# Patient Record
Sex: Female | Born: 1950 | Race: Black or African American | Hispanic: No | Marital: Married | Smoking: Never smoker
Health system: Southern US, Community
[De-identification: ages and names within clinical notes are randomized; demographics above are authoritative.]

## PROBLEM LIST (undated history)

## (undated) DIAGNOSIS — I10 Essential (primary) hypertension: Secondary | ICD-10-CM

## (undated) DIAGNOSIS — A64 Unspecified sexually transmitted disease: Secondary | ICD-10-CM

## (undated) DIAGNOSIS — E785 Hyperlipidemia, unspecified: Secondary | ICD-10-CM

## (undated) DIAGNOSIS — N739 Female pelvic inflammatory disease, unspecified: Secondary | ICD-10-CM

## (undated) DIAGNOSIS — M1990 Unspecified osteoarthritis, unspecified site: Secondary | ICD-10-CM

## (undated) HISTORY — PX: PELVIC LAPAROSCOPY: SHX162

## (undated) HISTORY — PX: TONSILLECTOMY AND ADENOIDECTOMY: SUR1326

## (undated) HISTORY — DX: Essential (primary) hypertension: I10

## (undated) HISTORY — DX: Unspecified sexually transmitted disease: A64

## (undated) HISTORY — DX: Female pelvic inflammatory disease, unspecified: N73.9

## (undated) HISTORY — DX: Hyperlipidemia, unspecified: E78.5

---

## 1977-04-04 HISTORY — PX: DILATION AND CURETTAGE OF UTERUS: SHX78

## 1988-04-04 HISTORY — PX: BREAST BIOPSY: SHX20

## 1999-09-07 ENCOUNTER — Other Ambulatory Visit: Admission: RE | Admit: 1999-09-07 | Discharge: 1999-09-07 | Payer: Self-pay | Admitting: Obstetrics and Gynecology

## 2000-10-24 ENCOUNTER — Other Ambulatory Visit: Admission: RE | Admit: 2000-10-24 | Discharge: 2000-10-24 | Payer: Self-pay | Admitting: Obstetrics and Gynecology

## 2002-01-09 ENCOUNTER — Other Ambulatory Visit: Admission: RE | Admit: 2002-01-09 | Discharge: 2002-01-09 | Payer: Self-pay | Admitting: Obstetrics and Gynecology

## 2003-02-19 ENCOUNTER — Other Ambulatory Visit: Admission: RE | Admit: 2003-02-19 | Discharge: 2003-02-19 | Payer: Self-pay | Admitting: Obstetrics and Gynecology

## 2004-04-05 ENCOUNTER — Other Ambulatory Visit: Admission: RE | Admit: 2004-04-05 | Discharge: 2004-04-05 | Payer: Self-pay | Admitting: Obstetrics and Gynecology

## 2005-04-21 ENCOUNTER — Other Ambulatory Visit: Admission: RE | Admit: 2005-04-21 | Discharge: 2005-04-21 | Payer: Self-pay | Admitting: Obstetrics and Gynecology

## 2005-05-10 ENCOUNTER — Ambulatory Visit: Payer: Self-pay | Admitting: Internal Medicine

## 2005-05-30 ENCOUNTER — Ambulatory Visit: Payer: Self-pay | Admitting: Gastroenterology

## 2005-06-15 ENCOUNTER — Ambulatory Visit: Payer: Self-pay | Admitting: Gastroenterology

## 2005-12-26 ENCOUNTER — Ambulatory Visit: Payer: Self-pay | Admitting: Internal Medicine

## 2006-05-31 ENCOUNTER — Other Ambulatory Visit: Admission: RE | Admit: 2006-05-31 | Discharge: 2006-05-31 | Payer: Self-pay | Admitting: Gynecology

## 2007-02-23 ENCOUNTER — Ambulatory Visit: Payer: Self-pay | Admitting: Internal Medicine

## 2007-02-23 DIAGNOSIS — E785 Hyperlipidemia, unspecified: Secondary | ICD-10-CM

## 2007-02-23 DIAGNOSIS — I10 Essential (primary) hypertension: Secondary | ICD-10-CM | POA: Insufficient documentation

## 2007-02-26 LAB — CONVERTED CEMR LAB
AST: 24 units/L (ref 0–37)
Basophils Absolute: 0 10*3/uL (ref 0.0–0.1)
Bilirubin, Direct: 0.1 mg/dL (ref 0.0–0.3)
Cholesterol: 200 mg/dL (ref 0–200)
Eosinophils Absolute: 0.1 10*3/uL (ref 0.0–0.6)
Eosinophils Relative: 0.7 % (ref 0.0–5.0)
GFR calc Af Amer: 83 mL/min
GFR calc non Af Amer: 68 mL/min
Glucose, Bld: 87 mg/dL (ref 70–99)
HCT: 40.9 % (ref 36.0–46.0)
HDL: 62.1 mg/dL (ref >39.0)
Hemoglobin, Urine: NEGATIVE
LDL Cholesterol: 123 mg/dL — ABNORMAL HIGH (ref 0–99)
Lymphocytes Relative: 45.6 % (ref 12.0–46.0)
MCHC: 33.7 g/dL (ref 30.0–36.0)
MCV: 86.4 fL (ref 78.0–100.0)
Monocytes Absolute: 0.7 10*3/uL (ref 0.2–0.7)
Neutro Abs: 4.1 10*3/uL (ref 1.4–7.7)
Neutrophils Relative %: 45.6 % (ref 43.0–77.0)
Nitrite: NEGATIVE
Potassium: 3.7 meq/L (ref 3.5–5.1)
Sodium: 140 meq/L (ref 135–145)
TSH: 1.02 microintl units/mL (ref 0.35–5.50)
Total CHOL/HDL Ratio: 3.2
Urobilinogen, UA: 0.2 (ref 0.0–1.0)
WBC: 9.1 10*3/uL (ref 4.5–10.5)

## 2007-07-03 LAB — CONVERTED CEMR LAB: Pap Smear: NORMAL

## 2007-07-06 ENCOUNTER — Telehealth (INDEPENDENT_AMBULATORY_CARE_PROVIDER_SITE_OTHER): Payer: Self-pay | Admitting: *Deleted

## 2007-07-18 ENCOUNTER — Other Ambulatory Visit: Admission: RE | Admit: 2007-07-18 | Discharge: 2007-07-18 | Payer: Self-pay | Admitting: Obstetrics and Gynecology

## 2007-08-13 ENCOUNTER — Telehealth (INDEPENDENT_AMBULATORY_CARE_PROVIDER_SITE_OTHER): Payer: Self-pay | Admitting: *Deleted

## 2008-05-14 ENCOUNTER — Telehealth (INDEPENDENT_AMBULATORY_CARE_PROVIDER_SITE_OTHER): Payer: Self-pay | Admitting: *Deleted

## 2008-06-02 ENCOUNTER — Ambulatory Visit: Payer: Self-pay | Admitting: Internal Medicine

## 2008-06-02 LAB — CONVERTED CEMR LAB
ALT: 18 units/L (ref 0–35)
Basophils Relative: 1.1 % (ref 0.0–3.0)
CO2: 33 meq/L — ABNORMAL HIGH (ref 19–32)
Calcium: 9.4 mg/dL (ref 8.4–10.5)
Creatinine, Ser: 0.9 mg/dL (ref 0.4–1.2)
Glucose, Bld: 79 mg/dL (ref 70–99)
Hemoglobin: 13.1 g/dL (ref 12.0–15.0)
Leukocytes, UA: NEGATIVE
Lymphocytes Relative: 44.8 % (ref 12.0–46.0)
MCHC: 33.4 g/dL (ref 30.0–36.0)
Monocytes Relative: 9.6 % (ref 3.0–12.0)
Neutro Abs: 3.7 10*3/uL (ref 1.4–7.7)
Nitrite: NEGATIVE
RBC: 4.52 M/uL (ref 3.87–5.11)
Specific Gravity, Urine: >=1.03 (ref 1.000–1.035)
TSH: 0.73 microintl units/mL (ref 0.35–5.50)
Total CHOL/HDL Ratio: 3.1
Total Protein, Urine: NEGATIVE mg/dL
Total Protein: 7.5 g/dL (ref 6.0–8.3)
Triglycerides: 74 mg/dL (ref 0–149)
pH: 5.5 (ref 5.0–8.0)

## 2008-06-04 LAB — CONVERTED CEMR LAB: Vit D, 25-Hydroxy: 39 ng/mL (ref 30–89)

## 2008-07-28 ENCOUNTER — Ambulatory Visit: Payer: Self-pay | Admitting: Obstetrics and Gynecology

## 2008-07-28 ENCOUNTER — Other Ambulatory Visit: Admission: RE | Admit: 2008-07-28 | Discharge: 2008-07-28 | Payer: Self-pay | Admitting: Obstetrics and Gynecology

## 2008-07-28 ENCOUNTER — Encounter: Payer: Self-pay | Admitting: Obstetrics and Gynecology

## 2009-10-23 ENCOUNTER — Ambulatory Visit: Payer: Self-pay | Admitting: Internal Medicine

## 2009-10-23 LAB — CONVERTED CEMR LAB
ALT: 17 units/L (ref 0–35)
AST: 24 units/L (ref 0–37)
Albumin: 4 g/dL (ref 3.5–5.2)
Alkaline Phosphatase: 62 units/L (ref 39–117)
BUN: 22 mg/dL (ref 6–23)
Basophils Absolute: 0 10*3/uL (ref 0.0–0.1)
Basophils Relative: 0.6 % (ref 0.0–3.0)
Bilirubin Urine: NEGATIVE
Bilirubin, Direct: 0.1 mg/dL (ref 0.0–0.3)
CO2: 30 meq/L (ref 19–32)
Calcium: 9.5 mg/dL (ref 8.4–10.5)
Chloride: 104 meq/L (ref 96–112)
Cholesterol: 188 mg/dL (ref 0–200)
Creatinine, Ser: 0.9 mg/dL (ref 0.4–1.2)
Eosinophils Absolute: 0.1 10*3/uL (ref 0.0–0.7)
Eosinophils Relative: 1.2 % (ref 0.0–5.0)
GFR calc non Af Amer: 80.68 mL/min (ref >60)
Glucose, Bld: 77 mg/dL (ref 70–99)
HCT: 39.8 % (ref 36.0–46.0)
HDL: 58 mg/dL (ref >39.00)
Hemoglobin, Urine: NEGATIVE
Hemoglobin: 13.4 g/dL (ref 12.0–15.0)
Ketones, ur: NEGATIVE mg/dL
LDL Cholesterol: 111 mg/dL — ABNORMAL HIGH (ref 0–99)
Leukocytes, UA: NEGATIVE
Lymphocytes Relative: 44.2 % (ref 12.0–46.0)
Lymphs Abs: 3.1 10*3/uL (ref 0.7–4.0)
MCHC: 33.7 g/dL (ref 30.0–36.0)
MCV: 87.8 fL (ref 78.0–100.0)
Monocytes Absolute: 0.5 10*3/uL (ref 0.1–1.0)
Monocytes Relative: 6.6 % (ref 3.0–12.0)
Neutro Abs: 3.3 10*3/uL (ref 1.4–7.7)
Neutrophils Relative %: 47.4 % (ref 43.0–77.0)
Nitrite: NEGATIVE
Platelets: 252 10*3/uL (ref 150.0–400.0)
Potassium: 4.2 meq/L (ref 3.5–5.1)
RBC: 4.53 M/uL (ref 3.87–5.11)
RDW: 14.4 % (ref 11.5–14.6)
Sodium: 142 meq/L (ref 135–145)
Specific Gravity, Urine: 1.025 (ref 1.000–1.030)
TSH: 1.07 microintl units/mL (ref 0.35–5.50)
Total Bilirubin: 0.4 mg/dL (ref 0.3–1.2)
Total CHOL/HDL Ratio: 3
Total Protein, Urine: NEGATIVE mg/dL
Total Protein: 7.7 g/dL (ref 6.0–8.3)
Triglycerides: 93 mg/dL (ref 0.0–149.0)
Urine Glucose: NEGATIVE mg/dL
Urobilinogen, UA: 0.2 (ref 0.0–1.0)
VLDL: 18.6 mg/dL (ref 0.0–40.0)
WBC: 6.9 10*3/uL (ref 4.5–10.5)
pH: 6 (ref 5.0–8.0)

## 2010-01-15 ENCOUNTER — Ambulatory Visit: Payer: Self-pay | Admitting: Internal Medicine

## 2010-04-07 ENCOUNTER — Ambulatory Visit
Admission: RE | Admit: 2010-04-07 | Discharge: 2010-04-07 | Payer: Self-pay | Source: Home / Self Care | Attending: Internal Medicine | Admitting: Internal Medicine

## 2010-04-09 ENCOUNTER — Telehealth: Payer: Self-pay | Admitting: Internal Medicine

## 2010-05-04 NOTE — Assessment & Plan Note (Signed)
Summary: MED-PER PT WAS TOLD TO SCHED  STC   Vital Signs:  Patient profile:   60 year old female Height:      65 inches Weight:      165.75 pounds BMI:     27.68 O2 Sat:      96 % on Room air Temp:     99.2 degrees F oral Pulse rate:   73 / minute BP sitting:   142 / 90  (left arm) Cuff size:   regular  Vitals Entered By: Zella Ball Ewing CMA Duncan Dull) (October 23, 2009 8:07 AM)  O2 Flow:  Room air  Preventive Care Screening  Pap Smear:    Date:  06/02/2009    Results:  normal   Mammogram:    Date:  06/02/2009    Results:  normal   CC: Refills on medication/RE   CC:  Refills on medication/RE.  History of Present Illness: here to f/u - walks just about every other day outside [place] 2 miles, some zoomba classes as well;  wt overall stable ,  BP similar to today when saw GYN;  Pt denies CP, sob, doe, wheezing, orthopnea, pnd, worsening LE edema, palps, dizziness or syncope .  Pt denies new neuro symptoms such as headache, facial or extremity weakness  No fever, wt loss, night sweats, loss of appetite or other constitutional symptoms   Preventive Screening-Counseling & Management      Drug Use:  no.    Problems Prior to Update: 1)  Preventive Health Care  (ICD-V70.0) 2)  Hypertension  (ICD-401.9) 3)  Hyperlipidemia  (ICD-272.4) 4)  Preventive Health Care  (ICD-V70.0)  Medications Prior to Update: 1)  Simvastatin 40 Mg  Tabs (Simvastatin) .Marland Kitchen.. 1po Once Daily 2)  Hydrochlorothiazide 25 Mg Tabs (Hydrochlorothiazide) .Marland Kitchen.. 1 By Mouth Once Daily 3)  Metoprolol Tartrate 50 Mg Tabs (Metoprolol Tartrate) .Marland Kitchen.. 1 By Mouth Two Times A Day 4)  Multi-Day Vitamins   Tabs (Multiple Vitamin) .Marland Kitchen.. 1 Daily 5)  Calcium 600 600 Mg  Tabs (Calcium Carbonate) .Marland Kitchen.. 1 Every Day 6)  Adult Aspirin Ec Low Strength 81 Mg Tbec (Aspirin) .Marland Kitchen.. 1po Once Daily  Current Medications (verified): 1)  Simvastatin 40 Mg  Tabs (Simvastatin) .Marland Kitchen.. 1po Once Daily 2)  Atenolol-Chlorthalidone 100-25 Mg Tabs  (Atenolol-Chlorthalidone) .Marland Kitchen.. 1po Once Daily 3)  Multi-Day Vitamins   Tabs (Multiple Vitamin) .Marland Kitchen.. 1 Daily 4)  Calcium 600 600 Mg  Tabs (Calcium Carbonate) .Marland Kitchen.. 1 Every Day 5)  Adult Aspirin Ec Low Strength 81 Mg Tbec (Aspirin) .Marland Kitchen.. 1po Once Daily 6)  Lisinopril 20 Mg Tabs (Lisinopril) .Marland Kitchen.. 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 father with colon cancer mother with DM  Social History: Reviewed history from 06/02/2008 and no changes required. 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         all otherwise negative per pt -    Physical Exam  General:  alert and overweight-appearing.   Head:  normocephalic and atraumatic.   Eyes:  vision grossly intact, pupils equal, and pupils round.   Ears:  R ear normal and L ear normal.   Nose:  no external deformity and no nasal  discharge.   Mouth:  no gingival abnormalities and pharynx pink and moist.   Neck:  supple and no masses.   Lungs:  normal respiratory effort and normal breath sounds.   Heart:  normal rate and regular rhythm.   Abdomen:  soft, non-tender, and normal bowel sounds.   Msk:  no joint tenderness and no joint swelling.   Extremities:  no edema, no erythema  Neurologic:  cranial nerves II-XII intact and strength normal in all extremities.   Skin:  color normal and no rashes.   Psych:  memory intact for recent and remote and normally interactive.     Impression & Recommendations:  Problem # 1:  Preventive Health Care (ICD-V70.0)  Overall doing well, age appropriate education and counseling updated and referral for appropriate preventive services done unless declined, immunizations up to date or declined, diet counseling done if overweight, urged to quit smoking if smokes , most recent labs reviewed and current ordered if appropriate, ecg reviewed or declined (interpretation per ECG scanned in the EMR if done); information regarding Medicare Prevention requirements given if appropriate; speciality referrals updated as appropriate   Orders: TLB-BMP (Basic Metabolic Panel-BMET) (80048-METABOL) TLB-CBC Platelet - w/Differential (85025-CBCD) TLB-Hepatic/Liver Function Pnl (80076-HEPATIC) TLB-Lipid Panel (80061-LIPID) TLB-TSH (Thyroid Stimulating Hormone) (84443-TSH) TLB-Udip ONLY (81003-UDIP) T-Vitamin D (25-Hydroxy) (47829-56213)  Problem # 2:  HYPERTENSION (ICD-401.9)  The following medications were removed from the medication list:    Hydrochlorothiazide 25 Mg Tabs (Hydrochlorothiazide) .Marland Kitchen... 1 by mouth once daily Her updated medication list for this problem includes:    Atenolol-chlorthalidone 100-25 Mg Tabs (Atenolol-chlorthalidone) .Marland Kitchen... 1po once daily    Lisinopril 20 Mg Tabs (Lisinopril) .Marland Kitchen... 1 by mouth once daily uncontroled, meds adjusted , cont to monitor BP at home,   Problem #  3:  HYPERLIPIDEMIA (ICD-272.4)  Her updated medication list for this problem includes:    Simvastatin 40 Mg Tabs (Simvastatin) .Marland Kitchen... 1po once daily  Labs Reviewed: SGOT: 22 (06/02/2008)   SGPT: 18 (06/02/2008)   HDL:52.3 (06/02/2008), 62.1 (02/23/2007)  LDL:96 (06/02/2008), 123 (08/65/7846)  Chol:163 (06/02/2008), 200 (02/23/2007)  Trig:74 (06/02/2008), 72 (02/23/2007) stable overall by hx and exam, ok to continue meds/tx as is   Complete Medication List: 1)  Simvastatin 40 Mg Tabs (Simvastatin) .Marland Kitchen.. 1po once daily 2)  Atenolol-chlorthalidone 100-25 Mg Tabs (Atenolol-chlorthalidone) .Marland Kitchen.. 1po once daily 3)  Multi-day Vitamins Tabs (Multiple vitamin) .Marland Kitchen.. 1 daily 4)  Calcium 600 600 Mg Tabs (Calcium carbonate) .Marland Kitchen.. 1 every day 5)  Adult Aspirin Ec Low Strength 81 Mg Tbec (Aspirin) .Marland Kitchen.. 1po once daily 6)  Lisinopril 20 Mg Tabs (Lisinopril) .Marland Kitchen.. 1 by mouth once daily  Patient Instructions: 1)  Take an Aspirin every day - 81 mg  - 1 per day - COATED  2)  stop the metoprolol 3)  stop the hydrochlorothiazide 4)  start the atenolol-HCTZ  - 1 per day 5)  start the lisinopril 20 mg per  day - 1 per day 6)  Continue all previous medications as before this visit  7)  Please schedule a follow-up appointment in 3 months with: 8)  BMP prior to visit, ICD-9: 401.1 Prescriptions: SIMVASTATIN 40 MG  TABS (SIMVASTATIN) 1po once daily  #90 x 3   Entered and Authorized by:   Corwin Levins MD   Signed by:   Corwin Levins MD on 10/23/2009   Method used:   Electronically to        [facility]  [address] # [id number]* (retail)       [address]       [place], [place]  [address]       Ph: [phone] or [phone]       Fax: [phone]   RxID:   [id number] LISINOPRIL 20 MG TABS (LISINOPRIL) 1 by mouth once daily  #90 x 3   Entered and Authorized by:   Corwin Levins MD   Signed by:   Corwin Levins MD on 10/23/2009   Method used:   Electronically to        [facility]  [address] #  [id number]* (retail)       [address]       [place], [place]  [address]       Ph: [phone] or [phone]       Fax: [phone]   RxID:   [id number] ATENOLOL-CHLORTHALIDONE 100-25 MG TABS (ATENOLOL-CHLORTHALIDONE) 1po once daily  #90 x 3   Entered and Authorized by:   Corwin Levins MD   Signed by:   Corwin Levins MD on 10/23/2009   Method used:   Electronically to        [facility]  [address] [id number]* (retail)       [address]       [place], [place]  [address]       Ph: [phone] or [phone]       Fax: [phone]   RxID:   [id number]

## 2010-05-06 NOTE — Assessment & Plan Note (Signed)
Summary: cough/sore throat/cd   Vital Signs:  Patient profile:   60 year old female Height:      65 inches Weight:      164.50 pounds BMI:     27.47 O2 Sat:      98 % on Room air Temp:     98.7 degrees F oral Pulse rate:   67 / minute BP sitting:   122 / 78  (left arm) Cuff size:   regular  Vitals Entered By: Zella Ball Ewing CMA (AAMA) (April 07, 2010 2:58 PM)  O2 Flow:  Room air CC: Sore throat, congestion and cough/RE   CC:  Sore throat and congestion and cough/RE.  History of Present Illness: here with acute onset 1 wk fever, severe ST not improving with several OTC meds, and now increased prod cough greenish sputum in the past 2 days, but Pt denies CP, worsening sob, doe, wheezing, orthopnea, pnd, worsening LE edema, palps, dizziness or syncope  Pt denies new neuro symptoms such as headache, facial or extremity weakness  Pt denies polydipsia, polyuria,  Overall good compliance with meds, trying to follow low chol diet, wt stable, little excercise however .  No recent wt loss, night sweats, loss of appetite or other constitutional symptoms .  Overall good compliance with meds, and good tolerability.  Needs med refills as she is almost out.    Problems Prior to Update: 1)  Pharyngitis-acute  (ICD-462) 2)  Preventive Health Care  (ICD-V70.0) 3)  Hypertension  (ICD-401.9) 4)  Hyperlipidemia  (ICD-272.4) 5)  Preventive Health Care  (ICD-V70.0)  Medications Prior to Update: 1)  Simvastatin 40 Mg  Tabs (Simvastatin) .Marland Kitchen.. 1po Once Daily 2)  Atenolol-Chlorthalidone 100-25 Mg Tabs (Atenolol-Chlorthalidone) .Marland Kitchen.. 1po Once Daily 3)  Multi-Day Vitamins   Tabs (Multiple Vitamin) .Marland Kitchen.. 1 Daily 4)  Calcium 600 600 Mg  Tabs (Calcium Carbonate) .Marland Kitchen.. 1 Every Day 5)  Adult Aspirin Ec Low Strength 81 Mg Tbec (Aspirin) .Marland Kitchen.. 1po Once Daily 6)  Lisinopril 20 Mg Tabs (Lisinopril) .Marland Kitchen.. 1 By Mouth Once Daily  Current Medications (verified): 1)  Simvastatin 40 Mg  Tabs (Simvastatin) .Marland Kitchen.. 1po Once  Daily 2)  Atenolol-Chlorthalidone 100-25 Mg Tabs (Atenolol-Chlorthalidone) .Marland Kitchen.. 1po Once Daily 3)  Multi-Day Vitamins   Tabs (Multiple Vitamin) .Marland Kitchen.. 1 Daily 4)  Calcium 600 600 Mg  Tabs (Calcium Carbonate) .Marland Kitchen.. 1 Every Day 5)  Adult Aspirin Ec Low Strength 81 Mg Tbec (Aspirin) .Marland Kitchen.. 1po Once Daily 6)  Lisinopril 20 Mg Tabs (Lisinopril) .Marland Kitchen.. 1 By Mouth Once Daily 7)  Azithromycin 250 Mg Tabs (Azithromycin) .... 2po Qd For 1 Day, Then 1po Qd For 4days, Then Stop 8)  Hydrocodone-Homatropine 5-1.5 Mg/72ml Syrp (Hydrocodone-Homatropine) .Marland Kitchen.. 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 , mild ill  Head:  normocephalic and atraumatic.   Eyes:  vision grossly intact, pupils equal, and pupils round.   Ears:  bilat tm's red, sinus nontender Nose:  nasal dischargemucosal pallor and  mucosal edema.   Mouth:  pharyngeal erythema and fair dentition.   Neck:  supple and cervical lymphadenopathy.   Lungs:  normal respiratory effort and normal breath sounds.   Heart:  normal rate and regular rhythm.   Extremities:  no edema, no erythema    Impression & Recommendations:  Problem # 1:  PHARYNGITIS-ACUTE (ICD-462)  Her updated medication list for this problem includes:    Adult Aspirin Ec Low Strength 81 Mg Tbec (Aspirin) .Marland Kitchen... 1po once daily    Azithromycin 250 Mg Tabs (Azithromycin) .Marland Kitchen... 2po qd for 1 day, then 1po qd for 4days, then stop with significant  cough - treat as above, f/u any worsening signs or symptoms   Problem # 2:  HYPERTENSION (ICD-401.9)  Her updated medication list for this problem includes:    Atenolol-chlorthalidone 100-25 Mg Tabs (Atenolol-chlorthalidone) .Marland Kitchen... 1po once daily    Lisinopril 20 Mg Tabs (Lisinopril) .Marland Kitchen... 1 by mouth once daily  BP today: 122/78 Prior BP: 142/90 (10/23/2009)  Labs Reviewed: K+: 4.2 (10/23/2009) Creat: : 0.9 (10/23/2009)   Chol: 188 (10/23/2009)   HDL: 58.00 (10/23/2009)   LDL: 111 (10/23/2009)   TG: 93.0 (10/23/2009) stable overall by hx and exam, ok to continue meds/tx as is   Problem # 3:  HYPERLIPIDEMIA (ICD-272.4)  Her updated medication list for this problem includes:    Simvastatin 40 Mg Tabs (Simvastatin) .Marland Kitchen... 1po once daily  Labs Reviewed: SGOT: 24 (10/23/2009)   SGPT: 17 (10/23/2009)   HDL:58.00 (10/23/2009), 52.3 (06/02/2008)  LDL:111 (10/23/2009), 96 (52/84/1324)  Chol:188 (10/23/2009), 163 (06/02/2008)  Trig:93.0 (10/23/2009), 74 (06/02/2008) d/w pt, labs reviewed, Continue all previous medications as before this visit , Pt to continue diet efforts, good med tolerance; to check labs next visit - goal LDL less than 100  Complete Medication List: 1)  Simvastatin 40 Mg Tabs (Simvastatin) .Marland Kitchen.. 1po once daily 2)  Atenolol-chlorthalidone 100-25 Mg Tabs (Atenolol-chlorthalidone) .Marland Kitchen.. 1po once daily 3)  Multi-day Vitamins Tabs (Multiple vitamin) .Marland Kitchen.. 1 daily 4)  Calcium 600 600 Mg Tabs (Calcium carbonate) .Marland Kitchen.. 1 every day 5)  Adult Aspirin Ec Low Strength 81 Mg Tbec (Aspirin) .Marland Kitchen.. 1po once daily 6)  Lisinopril 20 Mg Tabs (Lisinopril) .Marland Kitchen.. 1 by mouth once daily 7)  Azithromycin 250 Mg Tabs (Azithromycin) .... 2po qd for 1 day, then 1po qd for 4days, then stop 8)  Hydrocodone-homatropine 5-1.5 Mg/26ml Syrp (Hydrocodone-homatropine) .Marland Kitchen.. 1 tsp by mouth q 6 hrs as needed  Patient Instructions: 1)  Please take all new medications as prescribed 2)  Continue all previous medications  as before this visit  3)  Please schedule a follow-up appointment in July 2012 for CPX with labs Prescriptions: LISINOPRIL 20 MG TABS (LISINOPRIL) 1 by mouth once daily  #90 x 3   Entered and Authorized by:   Corwin Levins MD   Signed by:   Corwin Levins MD on 04/07/2010   Method used:   Print then Give to Patient   RxID:   [id number] ATENOLOL-CHLORTHALIDONE 100-25 MG TABS (ATENOLOL-CHLORTHALIDONE) 1po once daily  #90 x 3   Entered and Authorized by:   Corwin Levins MD   Signed by:   Corwin Levins MD on 04/07/2010   Method used:   Print then Give to Patient   RxID:   [id number] SIMVASTATIN 40 MG  TABS (SIMVASTATIN) 1po once daily  #90 x 3   Entered and Authorized by:   Corwin Levins MD   Signed by:   Len Blalock  John MD on 04/07/2010   Method used:   Print then Give to Patient   RxID:   [id number] HYDROCODONE-HOMATROPINE 5-1.5 MG/5ML SYRP (HYDROCODONE-HOMATROPINE) 1 tsp by mouth q 6 hrs as needed  #6oz x 1   Entered and Authorized by:   Corwin Levins MD   Signed by:   Corwin Levins MD on 04/07/2010   Method used:   Print then Give to Patient   RxID:   [id number] AZITHROMYCIN 250 MG TABS (AZITHROMYCIN) 2po qd for 1 day, then 1po qd for 4days, then stop  #6 x 1   Entered and Authorized by:   Corwin Levins MD   Signed by:   Corwin Levins MD on 04/07/2010   Method used:   Print then Give to Patient   RxID:   [id number]    Orders Added: 1)  Est. Patient Level IV [01027]

## 2010-05-06 NOTE — Progress Notes (Signed)
Summary: ALT med  Phone Note Call from Patient Call back at [facility] Phone [phone]   Caller: Patient Summary of Call: Pt called stating that ABX Rxd at OV is causing severe nausea, pt is requesting alternate med. Initial call taken by: Margaret Pyle, CMA,  April 09, 2010 8:32 AM  Follow-up for Phone Call        zpack does not normally do this, but ok to change  - done per emr Follow-up by: Corwin Levins MD,  April 09, 2010 12:59 PM  Additional Follow-up for Phone Call Additional follow up Details #1::        Pt advised to take meds with food and try BLAND diet for nausea as well. Pt told of Rx change Additional Follow-up by: Margaret Pyle, CMA,  April 09, 2010 1:30 PM    New/Updated Medications: CEPHALEXIN 500 MG CAPS (CEPHALEXIN) 1po three times a day Prescriptions: CEPHALEXIN 500 MG CAPS (CEPHALEXIN) 1po three times a day  #30 x 0   Entered and Authorized by:   Corwin Levins MD   Signed by:   Corwin Levins MD on 04/09/2010   Method used:   Electronically to        [facility]  [address] [id number]* (retail)       [address]       [place], [place]  [address]       Ph: [phone] or [phone]       Fax: [phone]   RxID:   [id number]

## 2010-10-29 ENCOUNTER — Encounter: Payer: Self-pay | Admitting: Gynecology

## 2011-04-09 ENCOUNTER — Other Ambulatory Visit: Payer: Self-pay | Admitting: Internal Medicine

## 2011-07-12 ENCOUNTER — Other Ambulatory Visit: Payer: Self-pay | Admitting: Internal Medicine

## 2011-07-26 ENCOUNTER — Other Ambulatory Visit: Payer: Self-pay | Admitting: Obstetrics and Gynecology

## 2011-08-11 ENCOUNTER — Telehealth: Payer: Self-pay

## 2011-08-12 ENCOUNTER — Encounter: Payer: Self-pay | Admitting: Internal Medicine

## 2011-08-12 ENCOUNTER — Ambulatory Visit (INDEPENDENT_AMBULATORY_CARE_PROVIDER_SITE_OTHER): Payer: BC Managed Care – PPO | Admitting: Internal Medicine

## 2011-08-12 VITALS — BP 160/88 | HR 67 | Temp 98.9°F | Ht 65.0 in | Wt 165.6 lb

## 2011-08-12 DIAGNOSIS — R0981 Nasal congestion: Secondary | ICD-10-CM

## 2011-08-12 DIAGNOSIS — J309 Allergic rhinitis, unspecified: Secondary | ICD-10-CM

## 2011-08-12 DIAGNOSIS — J3489 Other specified disorders of nose and nasal sinuses: Secondary | ICD-10-CM

## 2011-08-12 DIAGNOSIS — I10 Essential (primary) hypertension: Secondary | ICD-10-CM

## 2011-08-12 MED ORDER — PROMETHAZINE-PHENYLEPHRINE 6.25-5 MG/5ML PO SYRP: 5.0000 mL | ORAL_SOLUTION | Freq: Four times a day (QID) | ORAL | Status: DC | PRN

## 2011-08-12 MED ORDER — FEXOFENADINE HCL 180 MG PO TABS: 180.0000 mg | ORAL_TABLET | Freq: Every day | ORAL | Status: DC

## 2011-08-12 MED ORDER — FLUTICASONE PROPIONATE 50 MCG/ACT NA SUSP: 2.0000 | Freq: Every day | NASAL | Status: DC

## 2011-08-12 NOTE — Patient Instructions (Signed)
It was good to see you today. If you develop worsening symptoms or fever, call and we can reconsider antibiotics, but it does not appear necessary to use antibiotics at this time. STOP AFRIN Start flonase nose spray, continue allegra every day and use syrup as needed for congestion/cough - Your prescription(s) have been submitted to your pharmacy. Please take as directed and contact our office if you believe you are having problem(s) with the medication(s). Please take your blood pressure medications as prescribed! Please schedule followup in 2-3 months for /bp and cholesterol check, call sooner if problems.

## 2011-08-12 NOTE — Assessment & Plan Note (Signed)
BP Readings from Last 3 Encounters:  08/12/11 160/88  04/07/10 122/78  10/23/09 142/90   There have been some probable medication compliance issues here. I have discussed with her the great importance of following the treatment plan exactly as directed in order to achieve a good medical outcome.

## 2011-08-12 NOTE — Progress Notes (Signed)
  Subjective:    HPI  complains of "head cold" symptoms  Onset >3 week ago, wax/wane symptoms  associated with rhinorrhea, sneezing, sore throat, mild headache and sinus pressure  temporary relief with OTC meds - using Afrin daily x 4 weeks Precipitated by sick contacts and season change  Not taking rx'd meds for blood pressure and chol  Past Medical History  Diagnosis Date  . Hypertension   . Dyslipidemia     Review of Systems Constitutional: No fever, no unexpected weight change Pulmonary: No pleurisy or hemoptysis Cardiovascular: No chest pain or palpitations     Objective:   Physical Exam BP 160/88  Pulse 67  Temp(Src) 98.9 F (37.2 C) (Temporal)  Ht 5\' 5"  (1.651 m)  Wt 165 lb 9.6 oz (75.116 kg)  BMI 27.56 kg/m2  SpO2 99% GEN: nontoxic appearing but audible head congestion HENT: NCAT, no sinus tenderness bilaterally, nares with swollen turbinates, pale and scant clear discharge, oropharynx mild erythema and cobblestoning, no exudate Eyes: Vision grossly intact, no conjunctivitis Lungs: Clear to auscultation without rhonchi or wheeze, no increased work of breathing Cardiovascular: Regular rate and rhythm, no bilateral edema      Assessment & Plan:  allergic rhinitis  Nasal congestion - component of rebound with chronic Afrin Cough and postnasal drip related to above hypertension   Stop Afrin Explained lack of efficacy for antibiotics without infection Flonase, Allegra, prescription cough and decongestant syrup - new prescriptions done Symptomatic care with Tylenol or Advil, hydration and rest -  salt gargle advised as needed Reminded of importance of med compliance with blood pressure meds

## 2011-08-18 ENCOUNTER — Telehealth: Payer: Self-pay

## 2011-08-18 MED ORDER — ANTIPYRINE-BENZOCAINE 5.4-1.4 % OT SOLN: 3.0000 [drp] | OTIC | Status: AC | PRN

## 2011-08-18 MED ORDER — AMOXICILLIN-POT CLAVULANATE 875-125 MG PO TABS: 1.0000 | ORAL_TABLET | Freq: Two times a day (BID) | ORAL | Status: AC

## 2011-08-18 NOTE — Telephone Encounter (Signed)
continue tx as rx'd - Also add Augmentin antibiotics bid x 1 week and use ear drops as needed for pain - both erx done

## 2011-08-18 NOTE — Telephone Encounter (Signed)
Pt advised of MD's recommendation, Rx/pharmacy.

## 2011-08-18 NOTE — Telephone Encounter (Signed)
Pt called stating that since OV she has developed a sever LT ear ache, no discharge but causing some dizziness. Pt is requesting advisement from MD.

## 2011-08-23 NOTE — Telephone Encounter (Signed)
error 

## 2011-08-25 ENCOUNTER — Other Ambulatory Visit: Payer: Self-pay | Admitting: Obstetrics and Gynecology

## 2011-10-06 ENCOUNTER — Encounter: Payer: Self-pay | Admitting: Internal Medicine

## 2011-10-06 DIAGNOSIS — Z0001 Encounter for general adult medical examination with abnormal findings: Secondary | ICD-10-CM | POA: Insufficient documentation

## 2011-10-14 ENCOUNTER — Ambulatory Visit: Payer: BC Managed Care – PPO | Admitting: Internal Medicine

## 2011-11-02 ENCOUNTER — Encounter: Payer: Self-pay | Admitting: Obstetrics and Gynecology

## 2011-11-07 ENCOUNTER — Encounter: Payer: Self-pay | Admitting: Gynecology

## 2011-11-07 DIAGNOSIS — E785 Hyperlipidemia, unspecified: Secondary | ICD-10-CM | POA: Insufficient documentation

## 2011-11-07 DIAGNOSIS — N739 Female pelvic inflammatory disease, unspecified: Secondary | ICD-10-CM | POA: Insufficient documentation

## 2011-11-16 ENCOUNTER — Ambulatory Visit: Payer: BC Managed Care – PPO | Admitting: Obstetrics and Gynecology

## 2011-11-22 ENCOUNTER — Encounter: Payer: Self-pay | Admitting: Obstetrics and Gynecology

## 2011-11-22 ENCOUNTER — Other Ambulatory Visit (HOSPITAL_COMMUNITY)
Admission: RE | Admit: 2011-11-22 | Discharge: 2011-11-22 | Disposition: A | Discharge status: Home / Self Care | Payer: BC Managed Care – PPO | Source: Ambulatory Visit | Attending: Obstetrics and Gynecology | Admitting: Obstetrics and Gynecology

## 2011-11-22 ENCOUNTER — Ambulatory Visit (INDEPENDENT_AMBULATORY_CARE_PROVIDER_SITE_OTHER): Payer: BC Managed Care – PPO | Admitting: Obstetrics and Gynecology

## 2011-11-22 VITALS — BP 144/86 | Ht 64.0 in | Wt 159.0 lb

## 2011-11-22 DIAGNOSIS — Z01419 Encounter for gynecological examination (general) (routine) without abnormal findings: Secondary | ICD-10-CM

## 2011-11-22 DIAGNOSIS — A64 Unspecified sexually transmitted disease: Secondary | ICD-10-CM | POA: Insufficient documentation

## 2011-11-22 MED ORDER — ACYCLOVIR 5 % EX OINT: 1.0000 "application " | TOPICAL_OINTMENT | CUTANEOUS | Status: DC

## 2011-11-22 MED ORDER — VALACYCLOVIR HCL 500 MG PO TABS: 500.0000 mg | ORAL_TABLET | Freq: Every day | ORAL | Status: DC

## 2011-11-22 NOTE — Progress Notes (Signed)
The patient came to see me today for an annual GYN exam. This was a new patient visit as we had not seen the patient for 3-1/2 years. She is menopausal. She is doing well without hormone replacement. She had a mammogram this Summer which was normal. When she used to be a patient of ours she had 2 normal bone densities with the last one in May, 2009. We have previously treated her with Valtrex to prevent HSV breakouts. She is also use the ointment when she does get a breakout. She is having no vaginal bleeding. She is having no pelvic pain. She does her lab work through her PCP. 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 She has had recalls for Pap smears but is never been treated for cervical dysplasia. Her last Pap smear was 2010 and was normal.  Past medical history, family history, social history in epic record and reviewed.  Physical examination: Kennon Portela present. HEENT within normal limits. Neck: Thyroid not large. No masses. Supraclavicular nodes: not enlarged. Breasts: Examined in both sitting and lying  position. No skin changes and no masses except behind the left nipple there is 1-2 mm firmness unchanged since 2010 and normal on  mammogram. Abdomen: Soft no guarding rebound or masses or hernia. Pelvic: External: Within normal limits. BUS: Within normal limits. Vaginal:within normal limits. Good estrogen effect. No evidence of cystocele rectocele or enterocele. Cervix: clean. Uterus: Normal size and shape. Adnexa: No masses. Rectovaginal exam: Confirmatory and negative. Extremities: Within normal limits.  Assessment: HSV-2. History of PID. #3 fibrocystic breasts  Plan: Valtrex 500 mg daily. Zovirax ointment when necessary.The new Pap smear guidelines were discussed with the patient. Pap done. Continue yearly mammograms. Reassured that change in left breast nipple is unchanged from 3-1/2 years ago.

## 2011-11-22 NOTE — Patient Instructions (Addendum)
Continue yearly mammograms 

## 2011-11-23 LAB — URINALYSIS W MICROSCOPIC + REFLEX CULTURE
Casts: NONE SEEN
Hgb urine dipstick: NEGATIVE
Leukocytes, UA: NEGATIVE
Nitrite: NEGATIVE
Specific Gravity, Urine: 1.029 (ref 1.005–1.030)
pH: 5.5 (ref 5.0–8.0)

## 2011-11-29 ENCOUNTER — Encounter: Payer: Self-pay | Admitting: Obstetrics and Gynecology

## 2012-01-27 ENCOUNTER — Other Ambulatory Visit: Payer: Self-pay | Admitting: Internal Medicine

## 2012-01-27 ENCOUNTER — Telehealth: Payer: Self-pay

## 2012-01-27 MED ORDER — ATENOLOL-CHLORTHALIDONE 100-25 MG PO TABS: 1.0000 | ORAL_TABLET | Freq: Every day | ORAL | Status: DC

## 2012-01-27 NOTE — Telephone Encounter (Signed)
Prescription filled.

## 2012-01-30 ENCOUNTER — Ambulatory Visit: Payer: BC Managed Care – PPO | Admitting: Internal Medicine

## 2012-01-31 ENCOUNTER — Encounter: Payer: Self-pay | Admitting: Internal Medicine

## 2012-01-31 ENCOUNTER — Ambulatory Visit (INDEPENDENT_AMBULATORY_CARE_PROVIDER_SITE_OTHER): Payer: BC Managed Care – PPO | Admitting: Internal Medicine

## 2012-01-31 VITALS — BP 120/78 | HR 55 | Temp 96.9°F | Ht 64.0 in | Wt 168.4 lb

## 2012-01-31 DIAGNOSIS — Z23 Encounter for immunization: Secondary | ICD-10-CM

## 2012-01-31 DIAGNOSIS — Z0000 Encounter for general adult medical examination without abnormal findings: Secondary | ICD-10-CM

## 2012-01-31 MED ORDER — ATENOLOL-CHLORTHALIDONE 100-25 MG PO TABS: 1.0000 | ORAL_TABLET | Freq: Every day | ORAL | Status: DC

## 2012-01-31 MED ORDER — LISINOPRIL 20 MG PO TABS: 20.0000 mg | ORAL_TABLET | Freq: Every day | ORAL | Status: DC

## 2012-01-31 MED ORDER — SIMVASTATIN 40 MG PO TABS: 40.0000 mg | ORAL_TABLET | Freq: Every day | ORAL | Status: DC

## 2012-01-31 NOTE — Assessment & Plan Note (Signed)

## 2012-01-31 NOTE — Progress Notes (Signed)
Subjective:    Patient ID: Kelli Jones, female    DOB: March 08, 1951, 61 y.o.   MRN: [id number]  HPI  Here for wellness and f/u;  Overall doing ok;  Pt denies CP, worsening SOB, DOE, wheezing, orthopnea, PND, worsening LE edema, palpitations, dizziness or syncope.  Pt denies neurological change such as new Headache, facial or extremity weakness.  Pt denies polydipsia, polyuria, or low sugar symptoms. Pt states overall good compliance with treatment and medications, good tolerability, and trying to follow lower cholesterol diet.  Pt denies worsening depressive symptoms, suicidal ideation or panic. No fever, wt loss, night sweats, loss of appetite, or other constitutional symptoms.  Pt states good ability with ADL's, low fall risk, home safety reviewed and adequate, no significant changes in hearing or vision, and occasionally active with exercise.  Has some minor nasal allergy symptoms.  Due for flu shot.  No acute complaints Past Medical History  Diagnosis Date  . Hypertension   . Dyslipidemia   . PID (pelvic inflammatory disease)     Tubal obstruction  . STD (sexually transmitted disease)     HSV   Past Surgical History  Procedure Date  . Breast biopsy 1990    neg  . Dilation and curettage of uterus 1979  . Pelvic laparoscopy     DL tubal lavage    reports that she has never smoked. She has never used smokeless tobacco. She reports that she drinks alcohol. She reports that she does not use illicit drugs. family history includes Colon cancer in her father; Diabetes in her mother; Hypertension in her mother; and Kidney cancer in her father. No Known Allergies Current Outpatient Prescriptions on File Prior to Visit  Medication Sig Dispense Refill  . DISCONTD: atenolol-chlorthalidone (TENORETIC) 100-25 MG per tablet Take 1 tablet by mouth daily.  30 tablet  0  . DISCONTD: lisinopril (PRINIVIL,ZESTRIL) 20 MG tablet take 1 tablet by mouth once daily  90 tablet  0  . DISCONTD: simvastatin  (ZOCOR) 40 MG tablet take 1 tablet by mouth once daily  30 tablet  0  . acyclovir ointment (ZOVIRAX) 5 % Apply 1 application topically every 3 (three) hours.  3 g  5  . fexofenadine (ALLEGRA) 180 MG tablet Take 1 tablet (180 mg total) by mouth daily.      . fluticasone (FLONASE) 50 MCG/ACT nasal spray Place 2 sprays into the nose daily.  16 g  6  . promethazine-phenylephrine (PROMETHAZINE-PHENYLEPHRINE) 6.25-5 MG/5ML SYRP Take 5 mLs by mouth every 6 (six) hours as needed (congestion/cough).  180 mL  0  . valACYclovir (VALTREX) 500 MG tablet Take 1 tablet (500 mg total) by mouth daily.  30 tablet  12   Review of Systems Review of Systems  Constitutional: Negative for diaphoresis, activity change, appetite change and unexpected weight change.  HENT: Negative for hearing loss, ear pain, facial swelling, mouth sores and neck stiffness.   Eyes: Negative for pain, redness and visual disturbance.  Respiratory: Negative for shortness of breath and wheezing.   Cardiovascular: Negative for chest pain and palpitations.  Gastrointestinal: Negative for diarrhea, blood in stool, abdominal distention and rectal pain.  Genitourinary: Negative for hematuria, flank pain and decreased urine volume.  Musculoskeletal: Negative for myalgias and joint swelling.  Skin: Negative for color change and wound.  Neurological: Negative for syncope and numbness.  Hematological: Negative for adenopathy.  Psychiatric/Behavioral: Negative for hallucinations, self-injury, decreased concentration and agitation.      Objective:   Physical Exam BP  120/78  Pulse 55  Temp 96.9 F (36.1 C) (Oral)  Ht 5\' 4"  (1.626 m)  Wt 168 lb 6 oz (76.374 kg)  BMI 28.90 kg/m2  SpO2 99% Physical Exam  VS noted Constitutional: Pt is oriented to person, place, and time. Appears well-developed and well-nourished.  HENT:  Head: Normocephalic and atraumatic.  Right Ear: External ear normal.  Left Ear: External ear normal.  Nose: Nose  normal.  Mouth/Throat: Oropharynx is clear and moist.  Eyes: Conjunctivae and EOM are normal. Pupils are equal, round, and reactive to light.  Neck: Normal range of motion. Neck supple. No JVD present. No tracheal deviation present.  Cardiovascular: Normal rate, regular rhythm, normal heart sounds and intact distal pulses.   Pulmonary/Chest: Effort normal and breath sounds normal.  Abdominal: Soft. Bowel sounds are normal. There is no tenderness.  Musculoskeletal: Normal range of motion. Exhibits no edema.  Lymphadenopathy:  Has no cervical adenopathy.  Neurological: Pt is alert and oriented to person, place, and time. Pt has normal reflexes. No cranial nerve deficit.  Skin: Skin is warm and dry. No rash noted.  Psychiatric:  Has  normal mood and affect. Behavior is normal.     Assessment & Plan:

## 2012-01-31 NOTE — Patient Instructions (Addendum)
You had the flu shot today Continue all other medications as before Your medications were refilled today Please continue your efforts at being more active, low cholesterol diet, and weight control. Please go to LAB in the Basement for the blood and/or urine tests to be done today You will be contacted by phone if any changes need to be made immediately.  Otherwise, you will receive a letter about your results with an explanation. Please remember to sign up for My Chart at your earliest convenience, as this will be important to you in the future with finding out test results. Please remember to followup with your GYN for the yearly pap smear and/or mammogram Please return in 1 year for your yearly visit, or sooner if needed, with Lab testing done 3-5 days before

## 2012-07-05 ENCOUNTER — Telehealth: Payer: Self-pay | Admitting: *Deleted

## 2012-07-05 NOTE — Telephone Encounter (Signed)
Left msg on triage requesting call abck. Called pt back she stated bp has been running high. Over 150's. Also had screening done at work cholesterol is elevated. Really concern about her tryglycerides in 200's. Inform pt will need to make f/u appt to discuss. Transferred to schedulers for appt...Raechel Chute

## 2012-07-06 ENCOUNTER — Other Ambulatory Visit (INDEPENDENT_AMBULATORY_CARE_PROVIDER_SITE_OTHER): Payer: BC Managed Care – PPO

## 2012-07-06 ENCOUNTER — Ambulatory Visit (INDEPENDENT_AMBULATORY_CARE_PROVIDER_SITE_OTHER): Payer: BC Managed Care – PPO | Admitting: Internal Medicine

## 2012-07-06 ENCOUNTER — Encounter: Payer: Self-pay | Admitting: Internal Medicine

## 2012-07-06 VITALS — BP 128/82 | HR 61 | Temp 97.8°F | Ht 65.0 in | Wt 169.1 lb

## 2012-07-06 DIAGNOSIS — Z0000 Encounter for general adult medical examination without abnormal findings: Secondary | ICD-10-CM

## 2012-07-06 DIAGNOSIS — R7309 Other abnormal glucose: Secondary | ICD-10-CM

## 2012-07-06 DIAGNOSIS — Z23 Encounter for immunization: Secondary | ICD-10-CM

## 2012-07-06 DIAGNOSIS — R7302 Impaired glucose tolerance (oral): Secondary | ICD-10-CM

## 2012-07-06 DIAGNOSIS — I10 Essential (primary) hypertension: Secondary | ICD-10-CM

## 2012-07-06 LAB — CBC WITH DIFFERENTIAL/PLATELET
Basophils Relative: 0.8 % (ref 0.0–3.0)
Eosinophils Relative: 0.9 % (ref 0.0–5.0)
Hemoglobin: 12.9 g/dL (ref 12.0–15.0)
Lymphocytes Relative: 40.7 % (ref 12.0–46.0)
MCV: 85.2 fl (ref 78.0–100.0)
Monocytes Absolute: 0.6 10*3/uL (ref 0.1–1.0)
Neutro Abs: 3.7 10*3/uL (ref 1.4–7.7)
Neutrophils Relative %: 49.3 % (ref 43.0–77.0)
RBC: 4.57 Mil/uL (ref 3.87–5.11)
WBC: 7.6 10*3/uL (ref 4.5–10.5)

## 2012-07-06 LAB — LIPID PANEL
Cholesterol: 165 mg/dL (ref 0–200)
HDL: 57.8 mg/dL (ref >39.00)
LDL Cholesterol: 96 mg/dL (ref 0–99)
Total CHOL/HDL Ratio: 3
Triglycerides: 57 mg/dL (ref 0.0–149.0)
VLDL: 11.4 mg/dL (ref 0.0–40.0)

## 2012-07-06 LAB — URINALYSIS, ROUTINE W REFLEX MICROSCOPIC
Nitrite: NEGATIVE
Total Protein, Urine: NEGATIVE
Urine Glucose: NEGATIVE
pH: 7 (ref 5.0–8.0)

## 2012-07-06 LAB — HEPATIC FUNCTION PANEL
ALT: 24 U/L (ref 0–35)
Bilirubin, Direct: 0.1 mg/dL (ref 0.0–0.3)
Total Bilirubin: 0.5 mg/dL (ref 0.3–1.2)

## 2012-07-06 LAB — BASIC METABOLIC PANEL
BUN: 12 mg/dL (ref 6–23)
Calcium: 9.2 mg/dL (ref 8.4–10.5)
Chloride: 102 mEq/L (ref 96–112)
Creatinine, Ser: 0.8 mg/dL (ref 0.4–1.2)
GFR: 88.94 mL/min (ref >60.00)

## 2012-07-06 MED ORDER — ACYCLOVIR 5 % EX OINT: 1.0000 "application " | TOPICAL_OINTMENT | CUTANEOUS | Status: DC

## 2012-07-06 MED ORDER — LISINOPRIL 20 MG PO TABS: 20.0000 mg | ORAL_TABLET | Freq: Every day | ORAL | Status: DC

## 2012-07-06 MED ORDER — ATENOLOL-CHLORTHALIDONE 100-25 MG PO TABS: 1.0000 | ORAL_TABLET | Freq: Every day | ORAL | Status: DC

## 2012-07-06 MED ORDER — SIMVASTATIN 40 MG PO TABS: 40.0000 mg | ORAL_TABLET | Freq: Every day | ORAL | Status: DC

## 2012-07-06 MED ORDER — ASPIRIN 81 MG PO TBEC: 81.0000 mg | DELAYED_RELEASE_TABLET | Freq: Every day | ORAL | Status: AC

## 2012-07-06 NOTE — Assessment & Plan Note (Signed)

## 2012-07-06 NOTE — Patient Instructions (Addendum)
You had the tetanus shot today (Tdap) Please re-start andcontinue all other medications, and refills have been done for everything today (except the valacyclovir) We will also ask the pharmacy about your medications since I am not sure about any medication you are taking twice per day Please check your Blood Pressures twice per day for 3 weeks Please continue your efforts at being more active, low cholesterol diet, and weight control. You are otherwise up to date with prevention measures today. Please go to the LAB in the Basement (turn left off the elevator) for the tests to be done today You will be contacted by phone if any changes need to be made immediately.  Otherwise, you will receive a letter about your results with an explanation, but please check with MyChart first. Thank you for enrolling in MyChart. Please follow the instructions below to securely access your online medical record. MyChart allows you to send messages to your doctor, view your test results, renew your prescriptions, schedule appointments, and more. To Log into My Chart online, please go by [employer] or [employer] to [email or web address], or download the MyChart App from the [employer] .  Your Username is:  [email or web address]  (pass [email or web address]) Please send a practice Message on Mychart later today.

## 2012-07-06 NOTE — Assessment & Plan Note (Signed)
Unclear overall control, with recent spotty med compliance, meds reviewed with pt, for refill all meds today, has BP cuff at home - for bid documentation for 3 wks and RTC

## 2012-07-06 NOTE — Assessment & Plan Note (Signed)
Asympt, for a1c today 

## 2012-07-06 NOTE — Progress Notes (Signed)
Subjective:    Patient ID: Kelli Jones, female    DOB: [birth year], 62 y.o.   MRN: [id number]  HPI  Here for wellness and f/u;  Overall doing ok;  Pt denies CP, worsening SOB, DOE, wheezing, orthopnea, PND, worsening LE edema, palpitations, dizziness or syncope.  Pt denies neurological change such as new headache, facial or extremity weakness.  Pt denies polydipsia, polyuria, or low sugar symptoms. Pt states overall good compliance with treatment and medications, good tolerability, and has been trying to follow lower cholesterol diet.  Pt denies worsening depressive symptoms, suicidal ideation or panic. No fever, night sweats, wt loss, loss of appetite, or other constitutional symptoms.  Pt states good ability with ADL's, has low fall risk, home safety reviewed and adequate, no other significant changes in hearing or vision, and only occasionally active with exercise. Pt does seem confused about her meds and admits to spotty compliance, states she thinks one of her meds is supposed to be BID (verified with pharmacy, she has no prescribed BID med).  Did have elev BP at 156/90, and glc 115 at wellness fair at work, and advised to return here.  Has been asked for labs for the past 2 yrs, which she has declined. Past Medical History  Diagnosis Date  . Hypertension   . Dyslipidemia   . PID (pelvic inflammatory disease)     Tubal obstruction  . STD (sexually transmitted disease)     HSV   Past Surgical History  Procedure Laterality Date  . Breast biopsy  1990    neg  . Dilation and curettage of uterus  1979  . Pelvic laparoscopy      DL tubal lavage    reports that she has never smoked. She has never used smokeless tobacco. She reports that  drinks alcohol. She reports that she does not use illicit drugs. family history includes Colon cancer in her father; Diabetes in her mother; Hypertension in her mother; and Kidney cancer in her father. No Known Allergies Current Outpatient Prescriptions on  File Prior to Visit  Medication Sig Dispense Refill  . valACYclovir (VALTREX) 500 MG tablet Take 1 tablet (500 mg total) by mouth daily.  30 tablet  12   No current facility-administered medications on file prior to visit.   Review of Systems Constitutional: Negative for diaphoresis, activity change, appetite change or unexpected weight change.  HENT: Negative for hearing loss, ear pain, facial swelling, mouth sores and neck stiffness.   Eyes: Negative for pain, redness and visual disturbance.  Respiratory: Negative for shortness of breath and wheezing.   Cardiovascular: Negative for chest pain and palpitations.  Gastrointestinal: Negative for diarrhea, blood in stool, abdominal distention or other pain Genitourinary: Negative for hematuria, flank pain or change in urine volume.  Musculoskeletal: Negative for myalgias and joint swelling.  Skin: Negative for color change and wound.  Neurological: Negative for syncope and numbness. other than noted Hematological: Negative for adenopathy.  Psychiatric/Behavioral: Negative for hallucinations, self-injury, decreased concentration and agitation.      Objective:   Physical Exam BP 128/82  Pulse 61  Temp(Src) 97.8 F (36.6 C) (Oral)  Ht 5\' 5"  (1.651 m)  Wt 169 lb 2 oz (76.715 kg)  BMI 28.14 kg/m2  SpO2 95% VS noted,  Constitutional: Pt is oriented to person, place, and time. Appears well-developed and well-nourished.  Head: Normocephalic and atraumatic.  Right Ear: External ear normal.  Left Ear: External ear normal.  Nose: Nose normal.  Mouth/Throat: Oropharynx is clear  and moist.  Eyes: Conjunctivae and EOM are normal. Pupils are equal, round, and reactive to light.  Neck: Normal range of motion. Neck supple. No JVD present. No tracheal deviation present.  Cardiovascular: Normal rate, regular rhythm, normal heart sounds and intact distal pulses.   Pulmonary/Chest: Effort normal and breath sounds normal.  Abdominal: Soft. Bowel  sounds are normal. There is no tenderness. No HSM  Musculoskeletal: Normal range of motion. Exhibits no edema.  Lymphadenopathy:  Has no cervical adenopathy.  Neurological: Pt is alert and oriented to person, place, and time. Pt has normal reflexes. No cranial nerve deficit.  Skin: Skin is warm and dry. No rash noted.  Psychiatric:   Behavior is normal. Mild nervous     Assessment & Plan:

## 2012-07-06 NOTE — Addendum Note (Signed)
Addended by: Scharlene Gloss B on: 07/06/2012 09:45 AM   Modules accepted: Orders

## 2012-07-27 ENCOUNTER — Ambulatory Visit: Payer: BC Managed Care – PPO | Admitting: Internal Medicine

## 2012-10-09 ENCOUNTER — Telehealth: Payer: Self-pay | Admitting: Internal Medicine

## 2012-10-09 NOTE — Telephone Encounter (Signed)
The patient called hoping to get her no-show fee taken off her account.  She states did call to cancel the appointment within the 24 hour time period. Thanks!

## 2012-11-06 ENCOUNTER — Encounter: Payer: Self-pay | Admitting: Gynecology

## 2012-11-22 ENCOUNTER — Ambulatory Visit (INDEPENDENT_AMBULATORY_CARE_PROVIDER_SITE_OTHER): Payer: BC Managed Care – PPO | Admitting: Gynecology

## 2012-11-22 ENCOUNTER — Encounter: Payer: Self-pay | Admitting: Gynecology

## 2012-11-22 VITALS — BP 120/70 | Ht 64.0 in | Wt 163.0 lb

## 2012-11-22 DIAGNOSIS — A609 Anogenital herpesviral infection, unspecified: Secondary | ICD-10-CM

## 2012-11-22 DIAGNOSIS — Z780 Asymptomatic menopausal state: Secondary | ICD-10-CM

## 2012-11-22 DIAGNOSIS — B009 Herpesviral infection, unspecified: Secondary | ICD-10-CM

## 2012-11-22 DIAGNOSIS — Z01419 Encounter for gynecological examination (general) (routine) without abnormal findings: Secondary | ICD-10-CM

## 2012-11-22 MED ORDER — VALACYCLOVIR HCL 500 MG PO TABS: 500.0000 mg | ORAL_TABLET | Freq: Two times a day (BID) | ORAL | Status: DC

## 2012-11-22 NOTE — Progress Notes (Signed)
Kelli Jones June 18, 1950 [id number]        62 y.o.  G1P1001 for annual exam.  Former patient of Dr. Eda Jones. Doing well without complaints  Past medical history,surgical history, medications, allergies, family history and social history were all reviewed and documented in the EPIC chart.  ROS:  Performed and pertinent positives and negatives are included in the history, assessment and plan .  Exam: Kelli Jones assistant Filed Vitals:   11/22/12 1413  BP: 120/70  Height: 5\' 4"  (1.626 m)  Weight: 163 lb (73.936 kg)   General appearance  Normal Skin grossly normal Head/Neck normal with no cervical or supraclavicular adenopathy thyroid normal Lungs  clear Cardiac RR, without RMG Abdominal  soft, nontender, without masses, organomegaly or hernia Breasts  examined lying and sitting without masses, retractions, discharge or axillary adenopathy. Pelvic  Ext/BUS/vagina  normal with atrophic changes  Cervix  normal with atrophic changes  Uterus  anteverted, normal size, shape and contour, midline and mobile nontender   Adnexa  Without masses or tenderness    Anus and perineum  normal   Rectovaginal  normal sphincter tone without palpated masses or tenderness.    Assessment/Plan:  62 y.o. G53P1001 female for annual exam.   1. Postmenopausal. Without significant symptoms such as hot flushes, night sweats, vaginal dryness or dyspareunia. No bleeding history. We'll continue to monitor. Report any bleeding. 2. HSV with occasional outbreaks. Uses Valtrex 500 mg twice daily for several days. Also uses acyclovir ointment. Refill of her Valtrex given #30 with 12 refills. Possible yeast daily from a suppressive standpoint also reviewed she's not interested in that at this time. 3. Pap smear 2013. The Pap smear done today. No history of abnormal Pap smears previously. Plan repeat at 3 year interval. 4. Mammography 11/2012. Continue with annual mammography. SBE monthly reviewed. 5. DEXA 2009 normal.  Recommend repeat now at the 5 year interval. Increased calcium and vitamin D reviewed. 6. Colonoscopy 2007. Repeat at their recommendation. 7. Health maintenance. No lab work done as it is all done through her primary physician's office. Followup one year, sooner as needed.    Note: This document was prepared with digital dictation and possible smart phrase technology. Any transcriptional errors that result from this process are unintentional.   Dara Lords MD, 2:44 PM 11/22/2012

## 2012-11-22 NOTE — Patient Instructions (Signed)
Follow up in one year, sooner as needed. 

## 2013-02-04 ENCOUNTER — Encounter: Payer: BC Managed Care – PPO | Admitting: Internal Medicine

## 2013-02-07 ENCOUNTER — Other Ambulatory Visit: Payer: Self-pay

## 2013-03-13 ENCOUNTER — Ambulatory Visit (INDEPENDENT_AMBULATORY_CARE_PROVIDER_SITE_OTHER): Payer: BC Managed Care – PPO | Admitting: Nurse Practitioner

## 2013-03-13 ENCOUNTER — Encounter: Payer: Self-pay | Admitting: Nurse Practitioner

## 2013-03-13 VITALS — BP 112/72 | HR 53 | Temp 97.6°F | Ht 65.0 in | Wt 163.8 lb

## 2013-03-13 DIAGNOSIS — R05 Cough: Secondary | ICD-10-CM

## 2013-03-13 DIAGNOSIS — R059 Cough, unspecified: Secondary | ICD-10-CM

## 2013-03-13 MED ORDER — BENZONATATE 100 MG PO CAPS: ORAL_CAPSULE | ORAL | Status: DC

## 2013-03-13 NOTE — Progress Notes (Signed)
Pre-visit discussion using our clinic review tool. No additional management support is needed unless otherwise documented below in the visit note.  

## 2013-03-13 NOTE — Progress Notes (Signed)
   Subjective:    Patient ID: Kelli Jones, female    DOB: 18-Oct-1950, 62 y.o.   MRN: [id number]  Cough This is a new (returned from cruise to [place] & [place] 3 da. Was coughing before left.) problem. The current episode started 1 to 4 weeks ago (2 wks). The problem occurs hourly (more at night when lays down). The cough is non-productive. Associated symptoms include headaches (resolved), postnasal drip and a sore throat (resolved, still scratchy). Pertinent negatives include no chest pain, chills, ear congestion, ear pain, fever, nasal congestion, shortness of breath or wheezing. The symptoms are aggravated by lying down. She has tried OTC cough suppressant for the symptoms. The treatment provided no relief.      Review of Systems  Constitutional: Positive for fatigue. Negative for fever, chills, activity change and appetite change.  HENT: Positive for congestion, postnasal drip, sore throat (resolved, still scratchy) and voice change. Negative for ear pain.   Respiratory: Positive for cough. Negative for shortness of breath and wheezing.   Cardiovascular: Negative for chest pain.  Gastrointestinal: Negative for nausea, vomiting, abdominal pain (feels sore from coughing) and diarrhea.  Musculoskeletal: Negative for back pain.  Neurological: Positive for headaches (resolved).  Hematological: Negative for adenopathy.       Objective:   Physical Exam  Vitals reviewed. Constitutional: She is oriented to person, place, and time. She appears well-developed and well-nourished. No distress.  HENT:  Head: Normocephalic and atraumatic.  Right Ear: External ear normal.  Left Ear: External ear normal.  Mouth/Throat: No oropharyngeal exudate.  Eyes: Conjunctivae are normal. Right eye exhibits no discharge. Left eye exhibits no discharge.  Neck: Normal range of motion. Neck supple. No thyromegaly present.  Cardiovascular: Normal rate, regular rhythm and normal heart sounds.   No murmur  heard. Pulmonary/Chest: Effort normal and breath sounds normal. No respiratory distress. She has no wheezes.  Lymphadenopathy:    She has no cervical adenopathy.  Neurological: She is alert and oriented to person, place, and time.  Skin: Skin is warm and dry.  Psychiatric: She has a normal mood and affect. Her behavior is normal. Thought content normal.          Assessment & Plan:  1. Cough Likely has had viral illness, symptoms improving.  benzonatate (TESSALON) 100 MG capsule; Take 1-2 capsules po up to 3 times daily PRN cough  Dispense: 60 capsule; Refill: 0 See pt instructions.

## 2013-03-13 NOTE — Patient Instructions (Signed)
You have had a viral illness that has caused some bronchial irritation. Start daily sinus rinses with Neilmed sinus rinse. Use benzonatate capsules for cough. Humidify air in your home. Rest. Sip fluids every hour. Call ffor re-evaluation if you develop fever or chest pain. Feel better!  Bronchitis Bronchitis is the body's way of reacting to injury and/or infection (inflammation) of the bronchi. Bronchi are the air tubes that extend from the windpipe into the lungs. If the inflammation becomes severe, it may cause shortness of breath. CAUSES  Inflammation may be caused by:  A virus.  Germs (bacteria).  Dust.  Allergens.  Pollutants and many other irritants. The cells lining the bronchial tree are covered with tiny hairs (cilia). These constantly beat upward, away from the lungs, toward the mouth. This keeps the lungs free of pollutants. When these cells become too irritated and are unable to do their job, mucus begins to develop. This causes the characteristic cough of bronchitis. The cough clears the lungs when the cilia are unable to do their job. Without either of these protective mechanisms, the mucus would settle in the lungs. Then you would develop pneumonia. Smoking is a common cause of bronchitis and can contribute to pneumonia. Stopping this habit is the single most important thing you can do to help yourself. TREATMENT   Your caregiver may prescribe an antibiotic if the cough is caused by bacteria. Also, medicines that open up your airways make it easier to breathe. Your caregiver may also recommend or prescribe an expectorant. It will loosen the mucus to be coughed up. Only take over-the-counter or prescription medicines for pain, discomfort, or fever as directed by your caregiver.  Removing whatever causes the problem (smoking, for example) is critical to preventing the problem from getting worse.  Cough suppressants may be prescribed for relief of cough symptoms.  Inhaled  medicines may be prescribed to help with symptoms now and to help prevent problems from returning.  For those with recurrent (chronic) bronchitis, there may be a need for steroid medicines. SEEK IMMEDIATE MEDICAL CARE IF:   During treatment, you develop more pus-like mucus (purulent sputum).  You have a fever.  You become progressively more ill.  You have increased difficulty breathing, wheezing, or shortness of breath. It is necessary to seek immediate medical care if you are elderly or sick from any other disease. MAKE SURE YOU:   Understand these instructions.  Will watch your condition.  Will get help right away if you are not doing well or get worse. Document Released: 03/21/2005 Document Revised: 11/21/2012 Document Reviewed: 11/13/2012 [facility] Patient Information 2014 [place], [place].

## 2013-03-14 ENCOUNTER — Telehealth: Payer: Self-pay | Admitting: Internal Medicine

## 2013-03-14 NOTE — Telephone Encounter (Signed)
Ok for [place] prn, or chloraseptic spray if still uncomfortable

## 2013-03-14 NOTE — Telephone Encounter (Signed)
Pt was seen yesterday.  She is requesting something for the congestion.  Still has tickling in throat.

## 2013-03-14 NOTE — Telephone Encounter (Signed)
Patient informed. 

## 2013-07-23 ENCOUNTER — Telehealth: Payer: Self-pay | Admitting: Internal Medicine

## 2013-07-23 MED ORDER — BENZONATATE 200 MG PO CAPS: 200.0000 mg | ORAL_CAPSULE | Freq: Three times a day (TID) | ORAL | Status: DC | PRN

## 2013-07-23 NOTE — Telephone Encounter (Signed)
Done erx 

## 2013-07-23 NOTE — Telephone Encounter (Signed)
Requesting benzonatate pills for cough to be sent to [facility] on [address].

## 2013-07-27 ENCOUNTER — Other Ambulatory Visit: Payer: Self-pay | Admitting: Internal Medicine

## 2013-08-02 ENCOUNTER — Telehealth: Payer: Self-pay

## 2013-08-02 MED ORDER — SIMVASTATIN 40 MG PO TABS: 40.0000 mg | ORAL_TABLET | Freq: Every day | ORAL | Status: DC

## 2013-08-02 MED ORDER — ATENOLOL-CHLORTHALIDONE 100-25 MG PO TABS: 1.0000 | ORAL_TABLET | Freq: Every day | ORAL | Status: DC

## 2013-08-02 NOTE — Telephone Encounter (Signed)
Refilled medication as requested and called the patient left a detailed message.

## 2013-08-02 NOTE — Telephone Encounter (Signed)
The patient has an apt with Dr.John on Wednesday for a med refill.  However, she states she is completely out of her Atenolol and Simvastatin and is hoping to get a refill of both today.  Pharmacy - [facility] [phone]

## 2013-08-07 ENCOUNTER — Ambulatory Visit: Payer: BC Managed Care – PPO | Admitting: Internal Medicine

## 2013-08-14 ENCOUNTER — Ambulatory Visit (INDEPENDENT_AMBULATORY_CARE_PROVIDER_SITE_OTHER): Payer: BC Managed Care – PPO | Admitting: Internal Medicine

## 2013-08-14 DIAGNOSIS — I10 Essential (primary) hypertension: Secondary | ICD-10-CM

## 2013-08-14 DIAGNOSIS — Z0289 Encounter for other administrative examinations: Secondary | ICD-10-CM

## 2013-08-15 NOTE — Progress Notes (Signed)
error 

## 2013-08-21 ENCOUNTER — Ambulatory Visit: Payer: BC Managed Care – PPO | Admitting: Internal Medicine

## 2013-11-06 DIAGNOSIS — Z803 Family history of malignant neoplasm of breast: Secondary | ICD-10-CM | POA: Diagnosis not present

## 2013-11-06 DIAGNOSIS — Z1231 Encounter for screening mammogram for malignant neoplasm of breast: Secondary | ICD-10-CM | POA: Diagnosis not present

## 2013-11-06 LAB — HM MAMMOGRAPHY

## 2013-11-07 ENCOUNTER — Other Ambulatory Visit (INDEPENDENT_AMBULATORY_CARE_PROVIDER_SITE_OTHER): Payer: Medicare Other

## 2013-11-07 ENCOUNTER — Ambulatory Visit (INDEPENDENT_AMBULATORY_CARE_PROVIDER_SITE_OTHER): Payer: Medicare Other | Admitting: Internal Medicine

## 2013-11-07 ENCOUNTER — Other Ambulatory Visit: Payer: Self-pay | Admitting: Internal Medicine

## 2013-11-07 ENCOUNTER — Encounter: Payer: Self-pay | Admitting: Gynecology

## 2013-11-07 ENCOUNTER — Encounter: Payer: Self-pay | Admitting: Internal Medicine

## 2013-11-07 VITALS — BP 132/86 | HR 67 | Temp 97.9°F | Ht 64.5 in | Wt 167.5 lb

## 2013-11-07 DIAGNOSIS — E785 Hyperlipidemia, unspecified: Secondary | ICD-10-CM | POA: Diagnosis not present

## 2013-11-07 DIAGNOSIS — R7309 Other abnormal glucose: Secondary | ICD-10-CM

## 2013-11-07 DIAGNOSIS — I10 Essential (primary) hypertension: Secondary | ICD-10-CM | POA: Diagnosis not present

## 2013-11-07 DIAGNOSIS — Z0000 Encounter for general adult medical examination without abnormal findings: Secondary | ICD-10-CM

## 2013-11-07 DIAGNOSIS — Z23 Encounter for immunization: Secondary | ICD-10-CM | POA: Diagnosis not present

## 2013-11-07 DIAGNOSIS — R7302 Impaired glucose tolerance (oral): Secondary | ICD-10-CM

## 2013-11-07 DIAGNOSIS — E782 Mixed hyperlipidemia: Secondary | ICD-10-CM

## 2013-11-07 LAB — CBC WITH DIFFERENTIAL/PLATELET
BASOS ABS: 0.1 10*3/uL (ref 0.0–0.1)
BASOS PCT: 0.9 % (ref 0.0–3.0)
Eosinophils Absolute: 0.2 10*3/uL (ref 0.0–0.7)
Eosinophils Relative: 2.4 % (ref 0.0–5.0)
HCT: 41 % (ref 36.0–46.0)
HEMOGLOBIN: 13.1 g/dL (ref 12.0–15.0)
LYMPHS ABS: 3.9 10*3/uL (ref 0.7–4.0)
Lymphocytes Relative: 43 % (ref 12.0–46.0)
MCHC: 31.8 g/dL (ref 30.0–36.0)
MCV: 87.8 fl (ref 78.0–100.0)
MONO ABS: 0.8 10*3/uL (ref 0.1–1.0)
Monocytes Relative: 8.4 % (ref 3.0–12.0)
NEUTROS ABS: 4.1 10*3/uL (ref 1.4–7.7)
Neutrophils Relative %: 45.3 % (ref 43.0–77.0)
Platelets: 282 10*3/uL (ref 150.0–400.0)
RBC: 4.67 Mil/uL (ref 3.87–5.11)
RDW: 14.9 % (ref 11.5–15.5)
WBC: 9 10*3/uL (ref 4.0–10.5)

## 2013-11-07 LAB — URINALYSIS, ROUTINE W REFLEX MICROSCOPIC
Bilirubin Urine: NEGATIVE
HGB URINE DIPSTICK: NEGATIVE
KETONES UR: NEGATIVE
Leukocytes, UA: NEGATIVE
Nitrite: NEGATIVE
Specific Gravity, Urine: 1.02 (ref 1.000–1.030)
TOTAL PROTEIN, URINE-UPE24: NEGATIVE
URINE GLUCOSE: NEGATIVE
Urobilinogen, UA: 0.2 (ref 0.0–1.0)
pH: 5 (ref 5.0–8.0)

## 2013-11-07 LAB — HEMOGLOBIN A1C: HEMOGLOBIN A1C: 6.1 % (ref 4.6–6.5)

## 2013-11-07 LAB — BASIC METABOLIC PANEL
BUN: 16 mg/dL (ref 6–23)
CALCIUM: 9.5 mg/dL (ref 8.4–10.5)
CHLORIDE: 98 meq/L (ref 96–112)
CO2: 30 mEq/L (ref 19–32)
CREATININE: 1.1 mg/dL (ref 0.4–1.2)
GFR: 66.79 mL/min (ref >60.00)
Glucose, Bld: 75 mg/dL (ref 70–99)
Potassium: 3.1 mEq/L — ABNORMAL LOW (ref 3.5–5.1)
SODIUM: 136 meq/L (ref 135–145)

## 2013-11-07 LAB — HEPATIC FUNCTION PANEL
ALT: 24 U/L (ref 0–35)
AST: 23 U/L (ref 0–37)
Albumin: 3.8 g/dL (ref 3.5–5.2)
Alkaline Phosphatase: 63 U/L (ref 39–117)
BILIRUBIN DIRECT: 0.1 mg/dL (ref 0.0–0.3)
BILIRUBIN TOTAL: 0.3 mg/dL (ref 0.2–1.2)
Total Protein: 7.6 g/dL (ref 6.0–8.3)

## 2013-11-07 LAB — LIPID PANEL
Cholesterol: 197 mg/dL (ref 0–200)
HDL: 53 mg/dL (ref >39.00)
LDL Cholesterol: 130 mg/dL — ABNORMAL HIGH (ref 0–99)
NONHDL: 144
Total CHOL/HDL Ratio: 4
Triglycerides: 72 mg/dL (ref 0.0–149.0)
VLDL: 14.4 mg/dL (ref 0.0–40.0)

## 2013-11-07 LAB — TSH: TSH: 0.93 u[IU]/mL (ref 0.35–4.50)

## 2013-11-07 MED ORDER — ATENOLOL-CHLORTHALIDONE 100-25 MG PO TABS: 1.0000 | ORAL_TABLET | Freq: Every day | ORAL | Status: DC

## 2013-11-07 MED ORDER — LISINOPRIL 20 MG PO TABS: 20.0000 mg | ORAL_TABLET | Freq: Every day | ORAL | Status: DC

## 2013-11-07 MED ORDER — SIMVASTATIN 40 MG PO TABS: 40.0000 mg | ORAL_TABLET | Freq: Every day | ORAL | Status: DC

## 2013-11-07 MED ORDER — VALACYCLOVIR HCL 500 MG PO TABS: 500.0000 mg | ORAL_TABLET | Freq: Two times a day (BID) | ORAL | Status: DC

## 2013-11-07 MED ORDER — ACYCLOVIR 5 % EX OINT: 1.0000 "application " | TOPICAL_OINTMENT | CUTANEOUS | Status: DC

## 2013-11-07 MED ORDER — POTASSIUM CHLORIDE ER 10 MEQ PO TBCR: 10.0000 meq | EXTENDED_RELEASE_TABLET | Freq: Every day | ORAL | Status: DC

## 2013-11-07 MED ORDER — ATORVASTATIN CALCIUM 20 MG PO TABS: 20.0000 mg | ORAL_TABLET | Freq: Every day | ORAL | Status: DC

## 2013-11-07 NOTE — Assessment & Plan Note (Signed)
stable overall by history and exam, recent data reviewed with pt, and pt to continue medical treatment as before,  to f/u any worsening symptoms or concerns Lab Results  Component Value Date   HGBA1C 6.1 11/07/2013   For f/u a1c

## 2013-11-07 NOTE — Assessment & Plan Note (Signed)
stable overall by history and exam, recent data reviewed with pt, and pt to continue medical treatment as before,  to f/u any worsening symptoms or concerns BP Readings from Last 3 Encounters:  11/07/13 132/86  03/13/13 112/72  11/22/12 120/70

## 2013-11-07 NOTE — Progress Notes (Signed)
Subjective:    Patient ID: Kelli Jones, female    DOB: October 26, 1950, 63 y.o.   MRN: [id number]  HPI  Here for wellness and f/u;  Overall doing ok;  Pt denies CP, worsening SOB, DOE, wheezing, orthopnea, PND, worsening LE edema, palpitations, dizziness or syncope.  Pt denies neurological change such as new headache, facial or extremity weakness.  Pt denies polydipsia, polyuria, or low sugar symptoms. Pt states overall good compliance with treatment and medications, good tolerability, and has been trying to follow lower cholesterol diet.  Pt denies worsening depressive symptoms, suicidal ideation or panic. No fever, night sweats, wt loss, loss of appetite, or other constitutional symptoms.  Pt states good ability with ADL's, has low fall risk, home safety reviewed and adequate, no other significant changes in hearing or vision, and only occasionally active with exercise.  Feels overwhelmed occas and fatigued.   Past Medical History  Diagnosis Date  . Hypertension   . Dyslipidemia   . PID (pelvic inflammatory disease)     Tubal obstruction  . STD (sexually transmitted disease)     HSV   Past Surgical History  Procedure Laterality Date  . Breast biopsy  1990    neg  . Dilation and curettage of uterus  1979  . Pelvic laparoscopy      DL tubal lavage    reports that she has never smoked. She has never used smokeless tobacco. She reports that she drinks alcohol. She reports that she does not use illicit drugs. family history includes Colon cancer in her father; Diabetes in her mother; Hypertension in her mother; Kidney cancer in her father. No Known Allergies Current Outpatient Prescriptions on File Prior to Visit  Medication Sig Dispense Refill  . aspirin 81 MG EC tablet Take 1 tablet (81 mg total) by mouth daily. Swallow whole.  30 tablet  12   No current facility-administered medications on file prior to visit.   Review of Systems Constitutional: Negative for increased diaphoresis,  other activity, appetite or other siginficant weight change  HENT: Negative for worsening hearing loss, ear pain, facial swelling, mouth sores and neck stiffness.   Eyes: Negative for other worsening pain, redness or visual disturbance.  Respiratory: Negative for shortness of breath and wheezing.   Cardiovascular: Negative for chest pain and palpitations.  Gastrointestinal: Negative for diarrhea, blood in stool, abdominal distention or other pain Genitourinary: Negative for hematuria, flank pain or change in urine volume.  Musculoskeletal: Negative for myalgias or other joint complaints.  Skin: Negative for color change and wound.  Neurological: Negative for syncope and numbness. other than noted Hematological: Negative for adenopathy. or other swelling Psychiatric/Behavioral: Negative for hallucinations, self-injury, decreased concentration or other worsening agitation.      Objective:   Physical Exam BP 132/86  Pulse 67  Temp(Src) 97.9 F (36.6 C) (Oral)  Ht 5' 4.5" (1.638 m)  Wt 167 lb 8 oz (75.978 kg)  BMI 28.32 kg/m2  SpO2 97% VS noted,  Constitutional: Pt is oriented to person, place, and time. Appears well-developed and well-nourished.  Head: Normocephalic and atraumatic.  Right Ear: External ear normal.  Left Ear: External ear normal.  Nose: Nose normal.  Mouth/Throat: Oropharynx is clear and moist.  Eyes: Conjunctivae and EOM are normal. Pupils are equal, round, and reactive to light.  Neck: Normal range of motion. Neck supple. No JVD present. No tracheal deviation present.  Cardiovascular: Normal rate, regular rhythm, normal heart sounds and intact distal pulses.   Pulmonary/Chest: Effort  normal and breath sounds without rales or wheezing  Abdominal: Soft. Bowel sounds are normal. NT. No HSM  Musculoskeletal: Normal range of motion. Exhibits no edema.  Lymphadenopathy:  Has no cervical adenopathy.  Neurological: Pt is alert and oriented to person, place, and time. Pt  has normal reflexes. No cranial nerve deficit. Motor grossly intact Skin: Skin is warm and dry. No rash noted.  Psychiatric:  Has normal mood and affect. Behavior is normal.     Assessment & Plan:

## 2013-11-07 NOTE — Assessment & Plan Note (Signed)

## 2013-11-07 NOTE — Progress Notes (Signed)
Pre visit review using our clinic review tool, if applicable. No additional management support is needed unless otherwise documented below in the visit note. 

## 2013-11-07 NOTE — Patient Instructions (Addendum)
You had the new Prevnar pneumonia shot today  Please continue all other medications as before, and refills have been done if requested.  Please have the pharmacy call with any other refills you may need.  Please continue your efforts at being more active, low cholesterol diet, and weight control.  You are otherwise up to date with prevention measures today.  Please keep your appointments with your specialists as you may have planned  Please go to the LAB in the Basement (turn left off the elevator) for the tests to be done today  You will be contacted by phone if any changes need to be made immediately.  Otherwise, you will receive a letter about your results with an explanation, but please check with MyChart first.  Please remember to sign up for MyChart if you have not done so, as this will be important to you in the future with finding out test results, communicating by private email, and scheduling acute appointments online when needed.  Please return in 1 year for your yearly visit, or sooner if needed 

## 2013-11-15 ENCOUNTER — Telehealth: Payer: Self-pay | Admitting: *Deleted

## 2013-11-15 NOTE — Telephone Encounter (Signed)
Not sure what to say based on the last exam, except to suggest holding the Lipitor for 3 wks to see if improved, then try re-start again to see if happens again

## 2013-11-15 NOTE — Telephone Encounter (Signed)
Pt stated since taking the astorvastatin her legs has been very weak sometimes they feel like they are going to give out. Requesting md recommendations...Johny Chess

## 2013-11-18 ENCOUNTER — Telehealth: Payer: Self-pay | Admitting: Internal Medicine

## 2013-11-18 NOTE — Telephone Encounter (Signed)
Notified pt with md response. Pt stated she did that already and legs started back being weak so she stop taking. Pt is also taking simvastatin 40 mg pt states not sure why md want her to take two cholesterol medications. She stated she has been taking simvastatin 40 mg for a while now. pls advise on which med pt suppose to be taking...Kelli Jones

## 2013-11-18 NOTE — Telephone Encounter (Signed)
Please only take the lipitor, as the simvastatin was apparently not working

## 2013-11-18 NOTE — Telephone Encounter (Signed)
Notified pt pt with md reponse. Pt states she is not wanti g to go against md orders but she just can't take the lipitor she has tried and it makes her leg her bad. Wanting to know can she up the dosage on the simvastatin 80 mg or changer to another statin...Johny Chess

## 2013-11-18 NOTE — Telephone Encounter (Signed)
  Ok, I understand what she means now, and will put lipitor on her "allergy" list (though it is really an intolerance)  The FDA  No longer recommends use of the 80 mg simvastatin except for those who have been tolerating it at that dose already, due to increased side effect risk and other medication interactions that can affect it  So, we should try change to another statin; crestor is usually easy to take but more expensive (though the most effective as well)  If pt will try the crestor (and see if covered with her insurance) that would be great  Otherwise we would go with the next best, being lovastatin 40 mg per day

## 2013-11-19 ENCOUNTER — Telehealth: Payer: Self-pay | Admitting: *Deleted

## 2013-11-19 MED ORDER — LOVASTATIN 40 MG PO TABS: 40.0000 mg | ORAL_TABLET | Freq: Every day | ORAL | Status: DC

## 2013-11-19 NOTE — Telephone Encounter (Signed)
Notified pt with md response. Pt is going to check with her pharmacy to see how much both meds would be with her insurance and she will call [facility] to get rx...Kelli Jones

## 2013-11-19 NOTE — Telephone Encounter (Signed)
Pt return call back she stated she check the prices on the crestor & lovastatin. Crestor too expensive requesting md to call in lovastatin pls...Kelli Jones

## 2013-11-19 NOTE — Telephone Encounter (Signed)
[facility] for lovastatin 40 - done erx

## 2013-11-19 NOTE — Telephone Encounter (Signed)
Notified pt rx sent to pharmacy../lmb 

## 2013-11-28 ENCOUNTER — Encounter: Payer: Self-pay | Admitting: Gynecology

## 2013-11-28 ENCOUNTER — Ambulatory Visit (INDEPENDENT_AMBULATORY_CARE_PROVIDER_SITE_OTHER): Payer: Medicare Other | Admitting: Gynecology

## 2013-11-28 VITALS — BP 120/76 | Ht 65.0 in | Wt 169.0 lb

## 2013-11-28 DIAGNOSIS — N952 Postmenopausal atrophic vaginitis: Secondary | ICD-10-CM | POA: Diagnosis not present

## 2013-11-28 DIAGNOSIS — B009 Herpesviral infection, unspecified: Secondary | ICD-10-CM | POA: Diagnosis not present

## 2013-11-28 DIAGNOSIS — A609 Anogenital herpesviral infection, unspecified: Secondary | ICD-10-CM

## 2013-11-28 NOTE — Progress Notes (Signed)
Kelli Jones April 10, 1950 [id number]        63 y.o.  G1P1001 for followup exam. Several issues noted below.  Past medical history,surgical history, problem list, medications, allergies, family history and social history were all reviewed and documented as reviewed in the EPIC chart.  ROS:  12 system ROS performed with pertinent positives and negatives included in the history, assessment and plan.   Additional significant findings :  None   Exam: [name] Counsellor Vitals:   11/28/13 1409  BP: 120/76  Height: 5\' 5"  (1.651 m)  Weight: 169 lb (76.658 kg)   General appearance:  Normal affect, orientation and appearance. Skin: Grossly normal HEENT: Without gross lesions.  No cervical or supraclavicular adenopathy. Thyroid normal.  Lungs:  Clear without wheezing, rales or rhonchi Cardiac: RR, without RMG Abdominal:  Soft, nontender, without masses, guarding, rebound, organomegaly or hernia Breasts:  Examined lying and sitting without masses, retractions, discharge or axillary adenopathy. Pelvic:  Ext/BUS/vagina with generalized atrophic changes.  Cervix with atrophic changes  Uterus anteverted, normal size, shape and contour, midline and mobile nontender   Adnexa  Without masses or tenderness    Anus and perineum  Normal   Rectovaginal  Normal sphincter tone without palpated masses or tenderness.    Assessment/Plan:  63 y.o. G47P1001 female for followup exam.   1. Postmenopausal/atrophic genital changes. Patient without significant symptoms of hot flushes, night sweats, vaginal dryness or dyspareunia. No vaginal bleeding. Continue to monitor and report any vaginal bleeding. 2. History of HSV. Occasional outbreaks. Has supply of Valtrex at home. Will call if she needs it refilled. 3. Pap smear 2013. No Pap smear done today. Plan repeat Pap smear next year 3 year interval. 4. Mammography 11/2013. Continue with annual mammography. SBE monthly reviewed. 5. DEXA 2009 normal. Is not  followup last year for DEXA as recommended. Recommended patient schedule now she agrees to do so. Increased calcium/vitamin D reviewed. 6. Colonoscopy 2007. Repeat at their recommended interval. Patient to check to see when she is due. 7. Health maintenance. No blood work done this is done at her primary physician's office. Followup one year, sooner as needed.   Note: This document was prepared with digital dictation and possible smart phrase technology. Any transcriptional errors that result from this process are unintentional.   Anastasio Auerbach MD, 2:28 PM 11/28/2013

## 2013-11-28 NOTE — Patient Instructions (Signed)
Follow up for bone density as scheduled.  You may obtain a copy of any labs that were done today by logging onto MyChart as outlined in the instructions provided with your AVS (after visit summary). The office will not call with normal lab results but certainly if there are any significant abnormalities then we will contact you.   Health Maintenance, Female A healthy lifestyle and preventative care can promote health and wellness.  Maintain regular health, dental, and eye exams.  Eat a healthy diet. Foods like vegetables, fruits, whole grains, low-fat dairy products, and lean protein foods contain the nutrients you need without too many calories. Decrease your intake of foods high in solid fats, added sugars, and salt. Get information about a proper diet from your caregiver, if necessary.  Regular physical exercise is one of the most important things you can do for your health. Most adults should get at least 150 minutes of moderate-intensity exercise (any activity that increases your heart rate and causes you to sweat) each week. In addition, most adults need muscle-strengthening exercises on 2 or more days a week.   Maintain a healthy weight. The body mass index (BMI) is a screening tool to identify possible weight problems. It provides an estimate of body fat based on height and weight. Your caregiver can help determine your BMI, and can help you achieve or maintain a healthy weight. For adults 20 years and older:  A BMI below 18.5 is considered underweight.  A BMI of 18.5 to 24.9 is normal.  A BMI of 25 to 29.9 is considered overweight.  A BMI of 30 and above is considered obese.  Maintain normal blood lipids and cholesterol by exercising and minimizing your intake of saturated fat. Eat a balanced diet with plenty of fruits and vegetables. Blood tests for lipids and cholesterol should begin at age 20 and be repeated every 5 years. If your lipid or cholesterol levels are high, you are over  50, or you are a high risk for heart disease, you may need your cholesterol levels checked more frequently.Ongoing high lipid and cholesterol levels should be treated with medicines if diet and exercise are not effective.  If you smoke, find out from your caregiver how to quit. If you do not use tobacco, do not start.  Lung cancer screening is recommended for adults aged 55 80 years who are at high risk for developing lung cancer because of a history of smoking. Yearly low-dose computed tomography (CT) is recommended for people who have at least a 30-pack-year history of smoking and are a current smoker or have quit within the past 15 years. A pack year of smoking is smoking an average of 1 pack of cigarettes a day for 1 year (for example: 1 pack a day for 30 years or 2 packs a day for 15 years). Yearly screening should continue until the smoker has stopped smoking for at least 15 years. Yearly screening should also be stopped for people who develop a health problem that would prevent them from having lung cancer treatment.  If you are pregnant, do not drink alcohol. If you are breastfeeding, be very cautious about drinking alcohol. If you are not pregnant and choose to drink alcohol, do not exceed 1 drink per day. One drink is considered to be 12 ounces (355 mL) of beer, 5 ounces (148 mL) of wine, or 1.5 ounces (44 mL) of liquor.  Avoid use of street drugs. Do not share needles with anyone. Ask for help   if you need support or instructions about stopping the use of drugs.  High blood pressure causes heart disease and increases the risk of stroke. Blood pressure should be checked at least every 1 to 2 years. Ongoing high blood pressure should be treated with medicines, if weight loss and exercise are not effective.  If you are 55 to 65 years old, ask your caregiver if you should take aspirin to prevent strokes.  Diabetes screening involves taking a blood sample to check your fasting blood sugar level.  This should be done once every 3 years, after age 45, if you are within normal weight and without risk factors for diabetes. Testing should be considered at a younger age or be carried out more frequently if you are overweight and have at least 1 risk factor for diabetes.  Breast cancer screening is essential preventative care for women. You should practice "breast self-awareness." This means understanding the normal appearance and feel of your breasts and may include breast self-examination. Any changes detected, no matter how small, should be reported to a caregiver. Women in their 20s and 30s should have a clinical breast exam (CBE) by a caregiver as part of a regular health exam every 1 to 3 years. After age 40, women should have a CBE every year. Starting at age 40, women should consider having a mammogram (breast X-ray) every year. Women who have a family history of breast cancer should talk to their caregiver about genetic screening. Women at a high risk of breast cancer should talk to their caregiver about having an MRI and a mammogram every year.  Breast cancer gene (BRCA)-related cancer risk assessment is recommended for women who have family members with BRCA-related cancers. BRCA-related cancers include breast, ovarian, tubal, and peritoneal cancers. Having family members with these cancers may be associated with an increased risk for harmful changes (mutations) in the breast cancer genes BRCA1 and BRCA2. Results of the assessment will determine the need for genetic counseling and BRCA1 and BRCA2 testing.  The Pap test is a screening test for cervical cancer. Women should have a Pap test starting at age 21. Between ages 21 and 29, Pap tests should be repeated every 2 years. Beginning at age 30, you should have a Pap test every 3 years as long as the past 3 Pap tests have been normal. If you had a hysterectomy for a problem that was not cancer or a condition that could lead to cancer, then you no  longer need Pap tests. If you are between ages 65 and 70, and you have had normal Pap tests going back 10 years, you no longer need Pap tests. If you have had past treatment for cervical cancer or a condition that could lead to cancer, you need Pap tests and screening for cancer for at least 20 years after your treatment. If Pap tests have been discontinued, risk factors (such as a new sexual partner) need to be reassessed to determine if screening should be resumed. Some women have medical problems that increase the chance of getting cervical cancer. In these cases, your caregiver may recommend more frequent screening and Pap tests.  The human papillomavirus (HPV) test is an additional test that may be used for cervical cancer screening. The HPV test looks for the virus that can cause the cell changes on the cervix. The cells collected during the Pap test can be tested for HPV. The HPV test could be used to screen women aged 30 years and older, and   be used in women of any age who have unclear Pap test results. After the age of 63, women should have HPV testing at the same frequency as a Pap test.  Colorectal cancer can be detected and often prevented. Most routine colorectal cancer screening begins at the age of 67 and continues through age 18. However, your caregiver Emel recommend screening at an earlier age if you have risk factors for colon cancer. On a yearly basis, your caregiver Vila provide home test kits to check for hidden blood in the stool. Use of a small camera at the end of a tube, to directly examine the colon (sigmoidoscopy or colonoscopy), can detect the earliest forms of colorectal cancer. Talk to your caregiver about this at age 65, when routine screening begins. Direct examination of the colon should be repeated every 5 to 10 years through age 70, unless early forms of pre-cancerous polyps or small growths are found.  Hepatitis C blood testing is recommended for all people born from  [birth year] through 1967 and any individual with known risks for hepatitis C.  Practice safe sex. Use condoms and avoid high-risk sexual practices to reduce the spread of sexually transmitted infections (STIs). Sexually active women aged 26 and younger should be checked for Chlamydia, which is a common sexually transmitted infection. Older women with new or multiple partners should also be tested for Chlamydia. Testing for other STIs is recommended if you are sexually active and at increased risk.  Osteoporosis is a disease in which the bones lose minerals and strength with aging. This can result in serious bone fractures. The risk of osteoporosis can be identified using a bone density scan. Women ages 9 and over and women at risk for fractures or osteoporosis should discuss screening with their caregivers. Ask your caregiver whether you should be taking a calcium supplement or vitamin D to reduce the rate of osteoporosis.  Menopause can be associated with physical symptoms and risks. Hormone replacement therapy is available to decrease symptoms and risks. You should talk to your caregiver about whether hormone replacement therapy is right for you.  Use sunscreen. Apply sunscreen liberally and repeatedly throughout the day. You should seek shade when your shadow is shorter than you. Protect yourself by wearing long sleeves, pants, a wide-brimmed hat, and sunglasses year round, whenever you are outdoors.  Notify your caregiver of new moles or changes in moles, especially if there is a change in shape or color. Also notify your caregiver if a mole is larger than the size of a pencil eraser.  Stay current with your immunizations. Document Released: 10/04/2010 Document Revised: 07/16/2012 Document Reviewed: 10/04/2010 [facility] Patient Information 2014 [employer].

## 2014-01-14 DIAGNOSIS — I10 Essential (primary) hypertension: Secondary | ICD-10-CM | POA: Diagnosis not present

## 2014-01-14 DIAGNOSIS — E785 Hyperlipidemia, unspecified: Secondary | ICD-10-CM | POA: Diagnosis not present

## 2014-02-03 ENCOUNTER — Encounter: Payer: Self-pay | Admitting: Gynecology

## 2014-02-04 ENCOUNTER — Ambulatory Visit (INDEPENDENT_AMBULATORY_CARE_PROVIDER_SITE_OTHER): Payer: Medicare Other

## 2014-02-04 DIAGNOSIS — N952 Postmenopausal atrophic vaginitis: Secondary | ICD-10-CM

## 2014-02-04 DIAGNOSIS — Z780 Asymptomatic menopausal state: Secondary | ICD-10-CM

## 2014-02-05 ENCOUNTER — Other Ambulatory Visit: Payer: Self-pay | Admitting: Gynecology

## 2014-02-05 DIAGNOSIS — Z780 Asymptomatic menopausal state: Secondary | ICD-10-CM

## 2014-05-02 ENCOUNTER — Telehealth: Payer: Self-pay | Admitting: *Deleted

## 2014-05-02 NOTE — Telephone Encounter (Signed)
LVM to see if pt already had Flu shot done or need an appointment to get one. 

## 2014-06-19 ENCOUNTER — Telehealth: Payer: Self-pay | Admitting: Internal Medicine

## 2014-06-20 ENCOUNTER — Telehealth: Payer: Self-pay

## 2014-06-20 DIAGNOSIS — R05 Cough: Secondary | ICD-10-CM | POA: Diagnosis not present

## 2014-06-20 NOTE — Telephone Encounter (Signed)
Left message for pt to call back if she still wants flu vaccine 

## 2014-11-11 DIAGNOSIS — Z1231 Encounter for screening mammogram for malignant neoplasm of breast: Secondary | ICD-10-CM | POA: Diagnosis not present

## 2014-11-14 ENCOUNTER — Encounter: Payer: Self-pay | Admitting: Gynecology

## 2014-11-25 ENCOUNTER — Other Ambulatory Visit: Payer: Self-pay | Admitting: Internal Medicine

## 2014-12-02 ENCOUNTER — Encounter: Payer: Medicare Other | Admitting: Internal Medicine

## 2014-12-09 ENCOUNTER — Encounter: Payer: Medicare Other | Admitting: Internal Medicine

## 2014-12-24 ENCOUNTER — Encounter: Payer: BC Managed Care – PPO | Admitting: Gynecology

## 2014-12-24 ENCOUNTER — Encounter: Payer: Self-pay | Admitting: Internal Medicine

## 2014-12-24 ENCOUNTER — Ambulatory Visit (INDEPENDENT_AMBULATORY_CARE_PROVIDER_SITE_OTHER): Payer: Medicare Other | Admitting: Internal Medicine

## 2014-12-24 VITALS — BP 118/64 | HR 56 | Temp 97.8°F | Ht 65.0 in | Wt 163.0 lb

## 2014-12-24 DIAGNOSIS — Z0000 Encounter for general adult medical examination without abnormal findings: Secondary | ICD-10-CM | POA: Diagnosis not present

## 2014-12-24 DIAGNOSIS — I10 Essential (primary) hypertension: Secondary | ICD-10-CM

## 2014-12-24 DIAGNOSIS — R001 Bradycardia, unspecified: Secondary | ICD-10-CM | POA: Diagnosis not present

## 2014-12-24 DIAGNOSIS — Z23 Encounter for immunization: Secondary | ICD-10-CM

## 2014-12-24 MED ORDER — LISINOPRIL 20 MG PO TABS: 20.0000 mg | ORAL_TABLET | Freq: Every day | ORAL | Status: DC

## 2014-12-24 MED ORDER — LOSARTAN POTASSIUM-HCTZ 100-25 MG PO TABS: 1.0000 | ORAL_TABLET | Freq: Every day | ORAL | Status: DC

## 2014-12-24 MED ORDER — VALACYCLOVIR HCL 500 MG PO TABS: 500.0000 mg | ORAL_TABLET | Freq: Two times a day (BID) | ORAL | Status: DC

## 2014-12-24 MED ORDER — LOVASTATIN 40 MG PO TABS: 40.0000 mg | ORAL_TABLET | Freq: Every day | ORAL | Status: DC

## 2014-12-24 MED ORDER — POTASSIUM CHLORIDE ER 10 MEQ PO TBCR: 10.0000 meq | EXTENDED_RELEASE_TABLET | Freq: Every day | ORAL | Status: DC

## 2014-12-24 MED ORDER — ATENOLOL-CHLORTHALIDONE 100-25 MG PO TABS: 1.0000 | ORAL_TABLET | Freq: Every day | ORAL | Status: DC

## 2014-12-24 NOTE — Assessment & Plan Note (Signed)
To stop the atenolol - chlorthalidone

## 2014-12-24 NOTE — Assessment & Plan Note (Signed)
To start losartan HCT (d/c lisinopril), cont to monitor BP at home, consider add amlodipine 5 qd if not as well controlled

## 2014-12-24 NOTE — Addendum Note (Signed)
Addended by: Lyman Bishop on: 12/24/2014 04:28 PM   Modules accepted: Orders

## 2014-12-24 NOTE — Progress Notes (Signed)
Subjective:    Patient ID: Kelli Jones, female    DOB: 10/08/1950, 64 y.o.   MRN: [id number]  HPI Here for wellness and f/u;  Overall doing ok;  Pt denies Chest pain, worsening SOB, DOE, wheezing, orthopnea, PND, worsening LE edema, palpitations, dizziness or syncope.  Pt denies neurological change such as new headache, facial or extremity weakness.  Pt denies polydipsia, polyuria, or low sugar symptoms. Pt states overall good compliance with treatment and medications, good tolerability, and has been trying to follow appropriate diet.  Pt denies worsening depressive symptoms, suicidal ideation or panic. No fever, night sweats, wt loss, loss of appetite, or other constitutional symptoms.  Pt states good ability with ADL's, has low fall risk, home safety reviewed and adequate, no other significant changes in hearing or vision, and only occasionally active with exercise.  No complaints today  HR noted slow today but asympt Past Medical History  Diagnosis Date  . Hypertension   . Dyslipidemia   . PID (pelvic inflammatory disease)     Tubal obstruction  . STD (sexually transmitted disease)     HSV   Past Surgical History  Procedure Laterality Date  . Breast biopsy  1990    neg  . Dilation and curettage of uterus  1979  . Pelvic laparoscopy      DL tubal lavage    reports that she has never smoked. She has never used smokeless tobacco. She reports that she drinks alcohol. She reports that she does not use illicit drugs. family history includes Colon cancer in her father; Diabetes in her mother; Hypertension in her mother; Kidney cancer in her father. No Known Allergies Current Outpatient Prescriptions on File Prior to Visit  Medication Sig Dispense Refill  . acyclovir ointment (ZOVIRAX) 5 % Apply 1 application topically every 3 (three) hours. 3 g 5  . aspirin 81 MG EC tablet Take 1 tablet (81 mg total) by mouth daily. Swallow whole. 30 tablet 12   No current facility-administered  medications on file prior to visit.   Review of Systems Constitutional: Negative for increased diaphoresis, other activity, appetite or siginficant weight change other than noted HENT: Negative for worsening hearing loss, ear pain, facial swelling, mouth sores and neck stiffness.   Eyes: Negative for other worsening pain, redness or visual disturbance.  Respiratory: Negative for shortness of breath and wheezing  Cardiovascular: Negative for chest pain and palpitations.  Gastrointestinal: Negative for diarrhea, blood in stool, abdominal distention or other pain Genitourinary: Negative for hematuria, flank pain or change in urine volume.  Musculoskeletal: Negative for myalgias or other joint complaints.  Skin: Negative for color change and wound or drainage.  Neurological: Negative for syncope and numbness. other than noted Hematological: Negative for adenopathy. or other swelling Psychiatric/Behavioral: Negative for hallucinations, SI, self-injury, decreased concentration or other worsening agitation.      Objective:   Physical Exam BP 118/64 mmHg  Pulse 56  Temp(Src) 97.8 F (36.6 C) (Oral)  Ht 5\' 5"  (1.651 m)  Wt 163 lb (73.936 kg)  BMI 27.12 kg/m2  SpO2 99% VS noted,  Constitutional: Pt is oriented to person, place, and time. Appears well-developed and well-nourished, in no significant distress Head: Normocephalic and atraumatic.  Right Ear: External ear normal.  Left Ear: External ear normal.  Nose: Nose normal.  Mouth/Throat: Oropharynx is clear and moist.  Eyes: Conjunctivae and EOM are normal. Pupils are equal, round, and reactive to light.  Neck: Normal range of motion. Neck supple. No  JVD present. No tracheal deviation present or significant neck LA or mass Cardiovascular: Normal rate, regular rhythm, normal heart sounds and intact distal pulses.   Pulmonary/Chest: Effort normal and breath sounds without rales or wheezing  Abdominal: Soft. Bowel sounds are normal. NT. No  HSM  Musculoskeletal: Normal range of motion. Exhibits no edema.  Lymphadenopathy:  Has no cervical adenopathy.  Neurological: Pt is alert and oriented to person, place, and time. Pt has normal reflexes. No cranial nerve deficit. Motor grossly intact Skin: Skin is warm and dry. No rash noted.  Psychiatric:  Has normal mood and affect. Behavior is normal.     Assessment & Plan:

## 2014-12-24 NOTE — Patient Instructions (Addendum)
You had the Pneumovax shot today  OK to stop the atenolol-chlorthalidone, and the lisinopril  Please take all new medication as prescribed - the losartan HCT  Please check your BP and heart rate in 3-5 days and call if > 140/90  Please continue all other medications as before, and refills have been done if requested.  Please have the pharmacy call with any other refills you may need.  Please continue your efforts at being more active, low cholesterol diet, and weight control.  You are otherwise up to date with prevention measures today.  Please keep your appointments with your specialists as you may have planned  Please go to the LAB in the Basement (turn left off the elevator) for the tests to be done today  You will be contacted by phone if any changes need to be made immediately.  Otherwise, you will receive a letter about your results with an explanation, but please check with MyChart first.  Please remember to sign up for MyChart if you have not done so, as this will be important to you in the future with finding out test results, communicating by private email, and scheduling acute appointments online when needed.  Please return in 6 months, or sooner if needed

## 2014-12-24 NOTE — Assessment & Plan Note (Signed)

## 2015-01-02 ENCOUNTER — Telehealth: Payer: Self-pay | Admitting: Internal Medicine

## 2015-01-02 MED ORDER — AMLODIPINE BESYLATE 5 MG PO TABS: 5.0000 mg | ORAL_TABLET | Freq: Every day | ORAL | Status: DC

## 2015-01-02 NOTE — Telephone Encounter (Signed)
Pt informed

## 2015-01-02 NOTE — Telephone Encounter (Signed)
Pt called called in said that Dr Jenny Reichmann told her to call back in and tell him if BP went back up. She said it has.  Last 3 days 145/77 Average.  Pulse was up to about 80 She said meds was changed and she has also been having little headaches   Best number [phone]

## 2015-01-02 NOTE — Telephone Encounter (Signed)
Ok to add amlodipine 5 mg per day, cont to monitor BP's

## 2015-01-02 NOTE — Telephone Encounter (Signed)
Pt has been advised and requests 30 day supply due to cost. Rx updated

## 2015-01-21 ENCOUNTER — Encounter: Payer: Self-pay | Admitting: Gynecology

## 2015-01-21 ENCOUNTER — Ambulatory Visit (INDEPENDENT_AMBULATORY_CARE_PROVIDER_SITE_OTHER): Payer: Medicare Other | Admitting: Gynecology

## 2015-01-21 ENCOUNTER — Other Ambulatory Visit (HOSPITAL_COMMUNITY)
Admission: RE | Admit: 2015-01-21 | Discharge: 2015-01-21 | Disposition: A | Discharge status: Home / Self Care | Payer: Medicare Other | Source: Ambulatory Visit | Attending: Gynecology | Admitting: Gynecology

## 2015-01-21 VITALS — BP 120/76 | Ht 65.5 in | Wt 170.0 lb

## 2015-01-21 DIAGNOSIS — N952 Postmenopausal atrophic vaginitis: Secondary | ICD-10-CM | POA: Diagnosis not present

## 2015-01-21 DIAGNOSIS — Z124 Encounter for screening for malignant neoplasm of cervix: Secondary | ICD-10-CM

## 2015-01-21 DIAGNOSIS — Z01419 Encounter for gynecological examination (general) (routine) without abnormal findings: Secondary | ICD-10-CM

## 2015-01-21 DIAGNOSIS — A609 Anogenital herpesviral infection, unspecified: Secondary | ICD-10-CM | POA: Diagnosis not present

## 2015-01-21 NOTE — Progress Notes (Signed)
Kelli Jones 24-Jul-1950 [id number]        64 y.o.  G1P1001 for breast and pelvic exam  Past medical history,surgical history, problem list, medications, allergies, family history and social history were all reviewed and documented as reviewed in the EPIC chart.  ROS:  Performed with pertinent positives and negatives included in the history, assessment and plan.   Additional significant findings :  none   Exam: [name] Counsellor Vitals:   01/21/15 1601  BP: 120/76  Height: 5' 5.5" (1.664 m)  Weight: 170 lb (77.111 kg)   General appearance:  Normal affect, orientation and appearance. Skin: Grossly normal HEENT: Without gross lesions.  No cervical or supraclavicular adenopathy. Thyroid normal.  Lungs:  Clear without wheezing, rales or rhonchi Cardiac: RR, without RMG Abdominal:  Soft, nontender, without masses, guarding, rebound, organomegaly or hernia Breasts:  Examined lying and sitting without masses, retractions, discharge or axillary adenopathy. Pelvic:  Ext/BUS/vagina with atrophic changes  Cervix with atrophic changes  Uterus anteverted, normal size, shape and contour, midline and mobile nontender   Adnexa  Without masses or tenderness    Anus and perineum  Normal   Rectovaginal  Normal sphincter tone without palpated masses or tenderness.    Assessment/Plan:  64 y.o. G66P1001 female for breast and pelvic exam.   1. Postmenopausal/atrophic genital changes. Without significant hot flushes, night sweats, vaginal dryness or any vaginal bleeding. Continue to monitor and report any issues or bleeding. 2. 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 Has supply at home and will call if needs refill. 3. DEXA 2015 normal. Plan repeat at 5 year interval. Increased calcium vitamin D reviewed. 4. Pap smear 2013. Pap smear done today. No history of significant abnormal Pap smears previously. 5. Mammography 11/2014. Continue with annual mammography. SBE  monthly reviewed. 6. Colonoscopy 2007. Planned repeat next year at 10 year interval and patient knows to call and arrange.  7. Health maintenance. No routine lab work done as patient reports this done at her primary physician's office. Follow up in one year, sooner as needed.   Anastasio Auerbach MD, 4:40 PM 01/21/2015

## 2015-01-21 NOTE — Patient Instructions (Signed)

## 2015-01-23 LAB — CYTOLOGY - PAP

## 2015-02-19 ENCOUNTER — Telehealth: Payer: Self-pay | Admitting: *Deleted

## 2015-02-19 NOTE — Telephone Encounter (Signed)
Received call pt is wanting to verify if she is taking correct med. MD change BP med at last visit. Inform pt what md has on med list she states she has been taking everything except for Valtrex...Kelli Jones

## 2015-06-05 ENCOUNTER — Emergency Department (INDEPENDENT_AMBULATORY_CARE_PROVIDER_SITE_OTHER)
Admission: EM | Admit: 2015-06-05 | Discharge: 2015-06-05 | Disposition: A | Discharge status: Home / Self Care | Payer: Medicare Other | Source: Home / Self Care | Attending: Family Medicine | Admitting: Family Medicine

## 2015-06-05 ENCOUNTER — Telehealth: Payer: Self-pay | Admitting: Internal Medicine

## 2015-06-05 ENCOUNTER — Encounter (HOSPITAL_COMMUNITY): Payer: Self-pay | Admitting: Nurse Practitioner

## 2015-06-05 DIAGNOSIS — S9032XA Contusion of left foot, initial encounter: Secondary | ICD-10-CM | POA: Diagnosis not present

## 2015-06-05 DIAGNOSIS — L84 Corns and callosities: Secondary | ICD-10-CM

## 2015-06-05 NOTE — Telephone Encounter (Signed)
Pt called state that ever since Dr. Jenny Reichmann change her BP medication, her BP stay up (today was 163/81). Pt state some headache and dizziness. Please give her a call about this. Pt has an appt with Dr. Jenny Reichmann on Monday 06/08/15.

## 2015-06-05 NOTE — ED Notes (Signed)
Pt c/o a palpable mass under skin on bottom of R foot x 1 month, has not gone away. She is unsure if it is related to her skin or if there may be a foreign body in her foot. She reports the area is painful with pressure, such as palpation or walking.

## 2015-06-05 NOTE — ED Provider Notes (Signed)
CSN: [id number]     Arrival date & time 06/05/15  1313 History   First MD Initiated Contact with Patient 06/05/15 1424     Chief Complaint  Patient presents with  . Foot Problem   (Consider location/radiation/quality/duration/timing/severity/associated sxs/prior Treatment) Patient is a 65 y.o. female presenting with lower extremity pain. The history is provided by the patient.  Foot Pain This is a chronic problem. Episode onset: right foot lesion for 6wks, mtp apin for 17mos to left foot. The problem has been gradually worsening. The symptoms are aggravated by walking.    Past Medical History  Diagnosis Date  . Hypertension   . Dyslipidemia   . PID (pelvic inflammatory disease)     Tubal obstruction  . STD (sexually transmitted disease)     HSV   Past Surgical History  Procedure Laterality Date  . Breast biopsy  1990    neg  . Dilation and curettage of uterus  1979  . Pelvic laparoscopy      DL tubal lavage   Family History  Problem Relation Age of Onset  . Colon cancer Father   . Kidney cancer Father   . Diabetes Mother   . Hypertension Mother   . Heart disease Brother    Social History  Substance Use Topics  . Smoking status: Never Smoker   . Smokeless tobacco: Never Used  . Alcohol Use: 0.0 oz/week    0 Standard drinks or equivalent per week     Comment: Rare   OB History    Gravida Para Term Preterm AB TAB SAB Ectopic Multiple Living   1 1 1       1      Review of Systems  Constitutional: Negative.   Musculoskeletal: Positive for gait problem.  Skin: Negative for wound.  All other systems reviewed and are negative.   Allergies  Review of patient's allergies indicates no known allergies.  Home Medications   Prior to Admission medications   Medication Sig Start Date End Date Taking? Authorizing Provider  acyclovir ointment (ZOVIRAX) 5 % Apply 1 application topically every 3 (three) hours. 11/07/13   Biagio Borg, MD  amLODipine (NORVASC) 5 MG tablet Take  1 tablet (5 mg total) by mouth daily. 01/02/15 01/02/16  Biagio Borg, MD  aspirin 81 MG EC tablet Take 1 tablet (81 mg total) by mouth daily. Swallow whole. 07/06/12   Biagio Borg, MD  losartan-hydrochlorothiazide (HYZAAR) 100-25 MG tablet Take 1 tablet by mouth daily.    Historical Provider, MD  lovastatin (MEVACOR) 40 MG tablet Take 1 tablet (40 mg total) by mouth at bedtime. 12/24/14   Biagio Borg, MD  potassium chloride (K-DUR) 10 MEQ tablet Take 1 tablet (10 mEq total) by mouth daily. 12/24/14   Biagio Borg, MD  valACYclovir (VALTREX) 500 MG tablet Take 1 tablet (500 mg total) by mouth 2 (two) times daily. With outbreak 12/24/14   Biagio Borg, MD   Meds Ordered and Administered this Visit  Medications - No data to display  BP 163/81 mmHg  Pulse 73  Temp(Src) 98 F (36.7 C) (Oral)  Resp 16  SpO2 98% No data found.   Physical Exam  Constitutional: She is oriented to person, place, and time. She appears well-developed and well-nourished. No distress.  Musculoskeletal: Normal range of motion. She exhibits tenderness.       Feet:  Neurological: She is alert and oriented to person, place, and time.  Skin: Skin is warm and  dry.  Nursing note reviewed.   ED Course  Procedures (including critical care time)  Labs Review Labs Reviewed - No data to display  Imaging Review No results found.   Visual Acuity Review  Right Eye Distance:   Left Eye Distance:   Bilateral Distance:    Right Eye Near:   Left Eye Near:    Bilateral Near:         MDM   1. Callus of foot   2. Contusion of left foot, initial encounter        Billy Fischer, MD 06/05/15 [phone]

## 2015-06-05 NOTE — Discharge Instructions (Signed)
See [facility] for care of foot problems

## 2015-06-08 ENCOUNTER — Ambulatory Visit (INDEPENDENT_AMBULATORY_CARE_PROVIDER_SITE_OTHER): Payer: Medicare Other | Admitting: Internal Medicine

## 2015-06-08 ENCOUNTER — Other Ambulatory Visit (INDEPENDENT_AMBULATORY_CARE_PROVIDER_SITE_OTHER): Payer: Medicare Other

## 2015-06-08 ENCOUNTER — Encounter: Payer: Self-pay | Admitting: Internal Medicine

## 2015-06-08 ENCOUNTER — Encounter: Payer: Self-pay | Admitting: Gastroenterology

## 2015-06-08 VITALS — BP 140/82 | HR 62 | Temp 98.1°F | Resp 20 | Wt 176.2 lb

## 2015-06-08 DIAGNOSIS — Z1159 Encounter for screening for other viral diseases: Secondary | ICD-10-CM

## 2015-06-08 DIAGNOSIS — E785 Hyperlipidemia, unspecified: Secondary | ICD-10-CM

## 2015-06-08 DIAGNOSIS — Z1211 Encounter for screening for malignant neoplasm of colon: Secondary | ICD-10-CM

## 2015-06-08 DIAGNOSIS — L84 Corns and callosities: Secondary | ICD-10-CM | POA: Insufficient documentation

## 2015-06-08 DIAGNOSIS — I10 Essential (primary) hypertension: Secondary | ICD-10-CM

## 2015-06-08 DIAGNOSIS — M79605 Pain in left leg: Secondary | ICD-10-CM | POA: Insufficient documentation

## 2015-06-08 LAB — CBC WITH DIFFERENTIAL/PLATELET
BASOS PCT: 0.7 % (ref 0.0–3.0)
Basophils Absolute: 0.1 10*3/uL (ref 0.0–0.1)
EOS ABS: 0.1 10*3/uL (ref 0.0–0.7)
EOS PCT: 1.2 % (ref 0.0–5.0)
HCT: 38.1 % (ref 36.0–46.0)
HEMOGLOBIN: 12.6 g/dL (ref 12.0–15.0)
LYMPHS ABS: 2.3 10*3/uL (ref 0.7–4.0)
Lymphocytes Relative: 33.1 % (ref 12.0–46.0)
MCHC: 33 g/dL (ref 30.0–36.0)
MCV: 85.4 fl (ref 78.0–100.0)
MONO ABS: 0.5 10*3/uL (ref 0.1–1.0)
Monocytes Relative: 7.7 % (ref 3.0–12.0)
NEUTROS PCT: 57.3 % (ref 43.0–77.0)
Neutro Abs: 4.1 10*3/uL (ref 1.4–7.7)
Platelets: 295 10*3/uL (ref 150.0–400.0)
RBC: 4.46 Mil/uL (ref 3.87–5.11)
RDW: 14.9 % (ref 11.5–15.5)
WBC: 7.1 10*3/uL (ref 4.0–10.5)

## 2015-06-08 LAB — URINALYSIS, ROUTINE W REFLEX MICROSCOPIC
Bilirubin Urine: NEGATIVE
Hgb urine dipstick: NEGATIVE
KETONES UR: NEGATIVE
Leukocytes, UA: NEGATIVE
NITRITE: NEGATIVE
PH: 6 (ref 5.0–8.0)
RBC / HPF: NONE SEEN (ref 0–?)
SPECIFIC GRAVITY, URINE: 1.025 (ref 1.000–1.030)
Total Protein, Urine: NEGATIVE
URINE GLUCOSE: NEGATIVE
Urobilinogen, UA: 0.2 (ref 0.0–1.0)

## 2015-06-08 LAB — LIPID PANEL
CHOL/HDL RATIO: 3
Cholesterol: 201 mg/dL — ABNORMAL HIGH (ref 0–200)
HDL: 62.7 mg/dL (ref >39.00)
LDL Cholesterol: 121 mg/dL — ABNORMAL HIGH (ref 0–99)
NonHDL: 138.7
TRIGLYCERIDES: 87 mg/dL (ref 0.0–149.0)
VLDL: 17.4 mg/dL (ref 0.0–40.0)

## 2015-06-08 LAB — BASIC METABOLIC PANEL
BUN: 15 mg/dL (ref 6–23)
CALCIUM: 9.4 mg/dL (ref 8.4–10.5)
CHLORIDE: 107 meq/L (ref 96–112)
CO2: 28 mEq/L (ref 19–32)
CREATININE: 0.9 mg/dL (ref 0.40–1.20)
GFR: 80.28 mL/min (ref >60.00)
Glucose, Bld: 89 mg/dL (ref 70–99)
Potassium: 4 mEq/L (ref 3.5–5.1)
Sodium: 141 mEq/L (ref 135–145)

## 2015-06-08 LAB — HEPATIC FUNCTION PANEL
ALT: 29 U/L (ref 0–35)
AST: 27 U/L (ref 0–37)
Albumin: 3.9 g/dL (ref 3.5–5.2)
Alkaline Phosphatase: 70 U/L (ref 39–117)
BILIRUBIN TOTAL: 0.3 mg/dL (ref 0.2–1.2)
Bilirubin, Direct: 0 mg/dL (ref 0.0–0.3)
TOTAL PROTEIN: 7.5 g/dL (ref 6.0–8.3)

## 2015-06-08 LAB — HEPATITIS C ANTIBODY: HCV AB: NEGATIVE

## 2015-06-08 LAB — TSH: TSH: 0.77 u[IU]/mL (ref 0.35–4.50)

## 2015-06-08 MED ORDER — IBUPROFEN 600 MG PO TABS: 600.0000 mg | ORAL_TABLET | Freq: Three times a day (TID) | ORAL | Status: DC | PRN

## 2015-06-08 MED ORDER — AMLODIPINE BESYLATE 10 MG PO TABS: 10.0000 mg | ORAL_TABLET | Freq: Every day | ORAL | Status: DC

## 2015-06-08 NOTE — Assessment & Plan Note (Signed)
C/w with incidental mild tendonitis, for ibuprofen prn, rest,  to f/u any worsening symptoms or concerns

## 2015-06-08 NOTE — Progress Notes (Signed)
Pre visit review using our clinic review tool, if applicable. No additional management support is needed unless otherwise documented below in the visit note. 

## 2015-06-08 NOTE — Assessment & Plan Note (Signed)
[facility] for [employer] referral,  to f/u any worsening symptoms or concerns

## 2015-06-08 NOTE — Progress Notes (Signed)
Subjective:    Patient ID: Kelli Jones, female    DOB: 05-21-1950, 65 y.o.   MRN: [id number]  HPI  Here for yearly f/u;  Overall doing ok;  Pt denies Chest pain, worsening SOB, DOE, wheezing, orthopnea, PND, worsening LE edema, palpitations, dizziness or syncope.  Pt denies neurological change such as new headache, facial or extremity weakness.  Pt denies polydipsia, polyuria, or low sugar symptoms. Pt states overall good compliance with treatment and medications, good tolerability, and has been trying to follow appropriate diet.  Pt denies worsening depressive symptoms, suicidal ideation or panic. No fever, night sweats, wt loss, loss of appetite, or other constitutional symptoms.  Pt states good ability with ADL's, has low fall risk, home safety reviewed and adequate, no other significant changes in hearing or vision, and only occasionally active with exercise. BP has been 140- 160 more on the higher side for last few months. Low HR resolved off BB.  Also with right foot lateral plantar sore spot, also sore area to left lateral post knee incidentally start yesterday after quite a bit of walking, just noticed this am.  Tolerating new statin as well. Past Medical History  Diagnosis Date  . Hypertension   . Dyslipidemia   . PID (pelvic inflammatory disease)     Tubal obstruction  . STD (sexually transmitted disease)     HSV   Past Surgical History  Procedure Laterality Date  . Breast biopsy  1990    neg  . Dilation and curettage of uterus  1979  . Pelvic laparoscopy      DL tubal lavage    reports that she has never smoked. She has never used smokeless tobacco. She reports that she drinks alcohol. She reports that she does not use illicit drugs. family history includes Colon cancer in her father; Diabetes in her mother; Heart disease in her brother; Hypertension in her mother; Kidney cancer in her father. No Known Allergies Current Outpatient Prescriptions on File Prior to Visit    Medication Sig Dispense Refill  . acyclovir ointment (ZOVIRAX) 5 % Apply 1 application topically every 3 (three) hours. 3 g 5  . aspirin 81 MG EC tablet Take 1 tablet (81 mg total) by mouth daily. Swallow whole. 30 tablet 12  . losartan-hydrochlorothiazide (HYZAAR) 100-25 MG tablet Take 1 tablet by mouth daily.    Marland Kitchen lovastatin (MEVACOR) 40 MG tablet Take 1 tablet (40 mg total) by mouth at bedtime. 90 tablet 3  . potassium chloride (K-DUR) 10 MEQ tablet Take 1 tablet (10 mEq total) by mouth daily. 90 tablet 3  . valACYclovir (VALTREX) 500 MG tablet Take 1 tablet (500 mg total) by mouth 2 (two) times daily. With outbreak 60 tablet 12   No current facility-administered medications on file prior to visit.   Review of Systems Constitutional: Negative for increased diaphoresis, other activity, appetite or siginficant weight change other than noted HENT: Negative for worsening hearing loss, ear pain, facial swelling, mouth sores and neck stiffness.   Eyes: Negative for other worsening pain, redness or visual disturbance.  Respiratory: Negative for shortness of breath and wheezing  Cardiovascular: Negative for chest pain and palpitations.  Gastrointestinal: Negative for diarrhea, blood in stool, abdominal distention or other pain Genitourinary: Negative for hematuria, flank pain or change in urine volume.  Musculoskeletal: Negative for myalgias or other joint complaints.  Skin: Negative for color change and wound or drainage.  Neurological: Negative for syncope and numbness. other than noted Hematological: Negative for  adenopathy. or other swelling Psychiatric/Behavioral: Negative for hallucinations, SI, self-injury, decreased concentration or other worsening agitation.      Objective:   Physical Exam BP 140/82 mmHg  Pulse 62  Temp(Src) 98.1 F (36.7 C) (Oral)  Resp 20  Wt 176 lb 4 oz (79.946 kg)  SpO2 98% VS noted,  Constitutional: Pt is oriented to person, place, and time. Appears  well-developed and well-nourished, in no significant distress Head: Normocephalic and atraumatic.  Right Ear: External ear normal.  Left Ear: External ear normal.  Nose: Nose normal.  Mouth/Throat: Oropharynx is clear and moist.  Eyes: Conjunctivae and EOM are normal. Pupils are equal, round, and reactive to light.  Neck: Normal range of motion. Neck supple. No JVD present. No tracheal deviation present or significant neck LA or mass Cardiovascular: Normal rate, regular rhythm, normal heart sounds and intact distal pulses.   Pulmonary/Chest: Effort normal and breath sounds without rales or wheezing  Abdominal: Soft. Bowel sounds are normal. NT. No HSM  Musculoskeletal: Normal range of motion. Exhibits no edema.  Lymphadenopathy:  Has no cervical adenopathy.  Neurological: Pt is alert and oriented to person, place, and time. Pt has normal reflexes. No cranial nerve deficit. Motor grossly intact Skin: Skin is warm and dry. No rash noted. Has corn vs wart lesion to plantar distal right lateral foot between 4th and 5th MTP Psychiatric:  Has normal mood and affect. Behavior is normal.  Tender area noted to left lateral hamstring insertion site     Assessment & Plan:

## 2015-06-08 NOTE — Assessment & Plan Note (Signed)
Mild uncontrolled, for incresae amlod to 10 mg per day,  to f/u any worsening symptoms or concerns /fu BP at home and next visit BP Readings from Last 3 Encounters:  06/08/15 140/82  06/05/15 163/81  01/21/15 120/76

## 2015-06-08 NOTE — Assessment & Plan Note (Signed)
stable overall by history and exam, recent data reviewed with pt, and pt to continue medical treatment as before,  to f/u any worsening symptoms or concerns Lab Results  Component Value Date   LDLCALC 130* 11/07/2013   For f;u labs

## 2015-06-08 NOTE — Patient Instructions (Addendum)
Ok to increase the amlodipine to 10 mg per day  Please take all new medication as prescribed - the ibuprofen for pain  Please continue all other medications as before, and refills have been done if requested.  Please have the pharmacy call with any other refills you may need.  Please continue your efforts at being more active, low cholesterol diet, and weight control.  You are otherwise up to date with prevention measures today.  Please keep your appointments with your specialists as you may have planned  You will be contacted regarding the referral for: colonoscopy, and podiatry  Please go to the LAB in the Basement (turn left off the elevator) for the tests to be done today  You will be contacted by phone if any changes need to be made immediately.  Otherwise, you will receive a letter about your results with an explanation, but please check with MyChart first.  Please remember to sign up for MyChart if you have not done so, as this will be important to you in the future with finding out test results, communicating by private email, and scheduling acute appointments online when needed.  Please return in 6 months, or sooner if needed

## 2015-06-16 ENCOUNTER — Encounter: Payer: Medicare Other | Admitting: Podiatry

## 2015-06-16 DIAGNOSIS — N63 Unspecified lump in breast: Secondary | ICD-10-CM | POA: Diagnosis not present

## 2015-06-16 NOTE — Progress Notes (Signed)
This encounter was created in error - please disregard.

## 2015-06-17 ENCOUNTER — Encounter: Payer: Self-pay | Admitting: Gynecology

## 2015-06-24 ENCOUNTER — Ambulatory Visit: Payer: Medicare Other | Admitting: Internal Medicine

## 2015-06-24 ENCOUNTER — Other Ambulatory Visit: Payer: Self-pay | Admitting: Radiology

## 2015-06-24 DIAGNOSIS — D242 Benign neoplasm of left breast: Secondary | ICD-10-CM | POA: Diagnosis not present

## 2015-06-24 DIAGNOSIS — N63 Unspecified lump in breast: Secondary | ICD-10-CM | POA: Diagnosis not present

## 2015-06-26 ENCOUNTER — Encounter: Payer: Self-pay | Admitting: Anesthesiology

## 2015-07-07 ENCOUNTER — Encounter: Payer: Self-pay | Admitting: Podiatry

## 2015-07-07 ENCOUNTER — Ambulatory Visit (INDEPENDENT_AMBULATORY_CARE_PROVIDER_SITE_OTHER): Payer: Medicare Other | Admitting: Podiatry

## 2015-07-07 VITALS — BP 144/68 | HR 71 | Resp 16

## 2015-07-07 DIAGNOSIS — M722 Plantar fascial fibromatosis: Secondary | ICD-10-CM

## 2015-07-07 DIAGNOSIS — Q828 Other specified congenital malformations of skin: Secondary | ICD-10-CM

## 2015-07-07 DIAGNOSIS — M205X2 Other deformities of toe(s) (acquired), left foot: Secondary | ICD-10-CM

## 2015-07-07 DIAGNOSIS — M2012 Hallux valgus (acquired), left foot: Secondary | ICD-10-CM | POA: Diagnosis not present

## 2015-07-07 MED ORDER — MELOXICAM 15 MG PO TABS: 15.0000 mg | ORAL_TABLET | Freq: Every day | ORAL | Status: DC

## 2015-07-07 MED ORDER — METHYLPREDNISOLONE 4 MG PO TBPK: ORAL_TABLET | ORAL | Status: DC

## 2015-07-07 NOTE — Patient Instructions (Signed)

## 2015-07-07 NOTE — Progress Notes (Signed)
   Subjective:    Patient ID: Kelli Jones, female    DOB: 1950/07/24, 65 y.o.   MRN: [id number]  HPI: She presents today with a several month duration of pain to the first metatarsophalangeal joint of the left foot. She states that anytime she is up on it for very long she starts to develop pain within the joint. Starts to hurt on the plantar medial aspect of the hallux and she points to the first metatarsophalangeal joint. She's also complaining of pain along the medial longitudinal arch that seems to originate in her right heel and again has been present for many months particularly painful after sitting for a while and she is back up to walk. She also has painful calluses to the plantar aspect of the forefoot bilaterally. She tries to trim them.    Review of Systems  All other systems reviewed and are negative.      Objective:   Physical Exam: Vital signs are stable alert and oriented 3. Pulses are strongly palpable. Neurologic sensorium is intact per Semmes-Weinstein monofilament. Deep tendon reflexes are intact. Muscle strength +5 over 5 dorsiflexion plantar flexors and inverters and everters all intrinsic musculature is intact. Orthopedic evaluation demonstrates limited range of motion of the first metatarsophalangeal joint left with thickening of the joint. Dorsal spurs are also palpable. Radiographs were not taken today because of mechanical failure. She has pain on range of motion particularly in range of motion of the left first metatarsophalangeal joint. She also experiences pain on palpation of the medial calcaneal tubercle of the right heel. No pain on medial and lateral compression of the calcaneus. Multiple porokeratotic lesions to the plantar aspect of the forefoot these do not demonstrate warts they appear to be porokeratosis.        Assessment & Plan:  Hallux limitus with capsulitis first metatarsophalangeal joint left. Plantar fasciitis right foot. 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  Plan: We discussed the etiology pathology conservative versus surgical therapies. I started her on a Medrol Dosepak to be followed by meloxicam. Placed her in a plantar fascial brace and a night splint. I injected the right heel today with Kenalog and local anesthetic. After Betadine skin prep I injected the dorsal aspect of the first metatarsophalangeal joint with dexamethasone and local anesthetic. I also debrided all reactive hyperkeratoses. We discussed appropriate shoe gear stretching exercises ice therapy and shoe gear modifications. I will follow-up with her in the near future.

## 2015-07-21 ENCOUNTER — Ambulatory Visit: Payer: Self-pay | Admitting: Surgery

## 2015-07-21 DIAGNOSIS — D242 Benign neoplasm of left breast: Secondary | ICD-10-CM | POA: Diagnosis not present

## 2015-07-21 NOTE — H&P (Signed)
History of Present Illness Kelli Jones. Kelli Kram MD; 07/21/2015 11:53 AM) The patient is a 65 year old female who presents with a breast mass. Referred by Dr. Isaiah Blakes for left breast intraductal papilloma PCP - Dr. Cathlean Cower  This is a 65 year old female who presents with a three-month history of a palpable mass behind her left areola. She had a left lumpectomy many years ago for benign mass. She underwent mammogram and ultrasound which showed a 7 mm intraductal mass. This was biopsied and returned a diagnosis of intraductal papilloma. She denies any nipple discharge. She denies any pain before the biopsy.  Menarche age 79 First pregnancy age 89 Breast-feed no Hormones no Family history negative   Other Problems Elbert Ewings, CMA; 07/21/2015 10:20 AM) Anxiety Disorder Arthritis Back Pain Heart murmur High blood pressure Hypercholesterolemia  Past Surgical History Elbert Ewings, CMA; 07/21/2015 10:20 AM) Breast Biopsy Left. Colon Polyp Removal - Colonoscopy Oral Surgery Tonsillectomy  Diagnostic Studies History Elbert Ewings, CMA; 07/21/2015 10:20 AM) Colonoscopy >10 years ago Mammogram within last year Pap Smear 1-5 years ago  Allergies Elbert Ewings, CMA; 07/21/2015 10:20 AM) No Known Drug Allergies 07/21/2015  Medication History Elbert Ewings, CMA; 07/21/2015 10:21 AM) AmLODIPine Besylate (10MG  Tablet, Oral) Active. Aspirin (81MG  Tablet, Oral) Active. Ibuprofen (600MG  Tablet, Oral) Active. Losartan Potassium-HCTZ (100-25MG  Tablet, Oral) Active. Lovastatin (40MG  Tablet, Oral) Active. MethylPREDNISolone (4MG  Tab Ther Pack, Oral) Active. Potassium Chloride ER (10MEQ Tablet ER, Oral) Active. Valtrex (500MG  Tablet, Oral) Active. Medications Reconciled  Social History Elbert Ewings, CMA; 07/21/2015 10:20 AM) Alcohol use Occasional alcohol use. Caffeine use Carbonated beverages, Coffee, Tea. No drug use Tobacco use Never smoker.  Family History  Elbert Ewings, [place]; 07/21/2015 10:20 AM) Cancer Father. Diabetes Mellitus Mother, Son. Heart Disease Brother. Hypertension Brother.  Pregnancy / Birth History Elbert Ewings, CMA; 07/21/2015 10:20 AM) Age at menarche 25 years. Age of menopause 55-60 Contraceptive History Intrauterine device, Oral contraceptives. Gravida 1 Maternal age 70-20 Para 1     Review of Systems Elbert Ewings CMA; 07/21/2015 10:20 AM) General Present- Weight Gain. Not Present- Appetite Loss, Chills, Fatigue, Fever, Night Sweats and Weight Loss. Skin Not Present- Change in Wart/Mole, Dryness, Hives, Jaundice, New Lesions, Non-Healing Wounds, Rash and Ulcer. HEENT Present- Wears glasses/contact lenses. Not Present- Earache, Hearing Loss, Hoarseness, Nose Bleed, Oral Ulcers, Ringing in the Ears, Seasonal Allergies, Sinus Pain, Sore Throat, Visual Disturbances and Yellow Eyes. Respiratory Not Present- Bloody sputum, Chronic Cough, Difficulty Breathing, Snoring and Wheezing. Breast Present- Breast Mass. Not Present- Breast Pain, Nipple Discharge and Skin Changes. Cardiovascular Present- Leg Cramps. Not Present- Chest Pain, Difficulty Breathing Lying Down, Palpitations, Rapid Heart Rate, Shortness of Breath and Swelling of Extremities. Gastrointestinal Not Present- Abdominal Pain, Bloating, Bloody Stool, Change in Bowel Habits, Chronic diarrhea, Constipation, Difficulty Swallowing, Excessive gas, Gets full quickly at meals, Hemorrhoids, Indigestion, Nausea, Rectal Pain and Vomiting. Female Genitourinary Present- Frequency and Urgency. Not Present- Nocturia, Painful Urination and Pelvic Pain. Neurological Present- Decreased Memory, Headaches and Numbness. Not Present- Fainting, Seizures, Tingling, Tremor, Trouble walking and Weakness. Psychiatric Present- Anxiety. Not Present- Bipolar, Change in Sleep Pattern, Depression, Fearful and Frequent crying. Endocrine Not Present- Cold Intolerance, Excessive Hunger, Hair  Changes, Heat Intolerance, Hot flashes and New Diabetes. Hematology Not Present- Easy Bruising, Excessive bleeding, Gland problems, HIV and Persistent Infections.  Vitals Elbert Ewings CMA; 07/21/2015 10:22 AM) 07/21/2015 10:21 AM Weight: 177 lb Height: 65in Body Surface Area: 1.88 m Body Mass Index: 29.45 kg/m  Temp.: 97.27F  Pulse: 71 (Regular)  BP: 126/76 (Sitting, Left Arm, Standard)      Physical Exam Rodman Key K. Loreto Loescher MD; 07/21/2015 11:54 AM)  The physical exam findings are as follows: Note:WDWN in NAD HEENT: EOMI, sclera anicteric Neck: No masses, no thyromegaly Lungs: CTA bilaterally; normal respiratory effort Breasts: symmetric; no right breast masses or lymphadenopathy; no nipple abnormality Left breast - no lymphadenopathy or nipple abnormality Behind the lateral part of the areola at 3:00, there is a firm 1 cm palpable mass CV: Regular rate and rhythm; no murmurs Abd: +bowel sounds, soft, non-tender, no masses Ext: Well-perfused; no edema Skin: Warm, dry; no sign of jaundice    Assessment & Plan Rodman Key K. Omunique Pederson MD; 07/21/2015 11:03 AM)  Kelli Jones PAPILLOMA OF LEFT BREAST (D24.2)  Current Plans Schedule for Surgery - Left radioactive seed localized lumpectomy. The surgical procedure has been discussed with the patient. Potential risks, benefits, alternative treatments, and expected outcomes have been explained. All of the patient's questions at this time have been answered. The likelihood of reaching the patient's treatment goal is good. The patient understand the proposed surgical procedure and wishes to proceed.  Kelli Jones. [name], MD, [facility]  General/ Trauma Surgery  07/21/2015 11:56 AM

## 2015-08-06 ENCOUNTER — Ambulatory Visit (INDEPENDENT_AMBULATORY_CARE_PROVIDER_SITE_OTHER): Payer: Medicare Other | Admitting: Podiatry

## 2015-08-06 ENCOUNTER — Encounter: Payer: Self-pay | Admitting: Podiatry

## 2015-08-06 VITALS — BP 158/84 | HR 80 | Resp 12

## 2015-08-06 DIAGNOSIS — M205X2 Other deformities of toe(s) (acquired), left foot: Secondary | ICD-10-CM

## 2015-08-06 DIAGNOSIS — Q828 Other specified congenital malformations of skin: Secondary | ICD-10-CM

## 2015-08-06 DIAGNOSIS — L859 Epidermal thickening, unspecified: Secondary | ICD-10-CM | POA: Diagnosis not present

## 2015-08-06 DIAGNOSIS — M722 Plantar fascial fibromatosis: Secondary | ICD-10-CM | POA: Diagnosis not present

## 2015-08-06 DIAGNOSIS — B070 Plantar wart: Secondary | ICD-10-CM

## 2015-08-06 DIAGNOSIS — B079 Viral wart, unspecified: Secondary | ICD-10-CM

## 2015-08-06 NOTE — Patient Instructions (Signed)

## 2015-08-08 NOTE — Progress Notes (Signed)
She presents today for follow-up of her capsulitis first metatarsophalangeal joint left foot and painful plantar fasciitis right foot she states that they're doing much better and she is very happy with the outcome thus far. She continues to take her meloxicam on a regular basis. She states that the most pain that she currently is suffering from comes from the porokeratotic lesion plantar aspect of the forefoot right.  Objective: Vital signs are stable alert and oriented 3. Mild tenderness on range of motion of the first metatarsophalangeal joint left foot much decrease in edema and erythema from previous visit. She is much decrease in pain on palpation medial calcaneal tubercle of the right heel. Porokeratotic lesion is still present plantar aspect of the right forefoot. No erythema cellulitis drainage or odor is noted.  Assessment: Well-healing plantar fasciitis and capsulitis/hallux limitus first metatarsal phalangeal joint. Porokeratosis is painful right forefoot. Cannot rule out verruca plantaris.  Plan: Discussed etiology pathology conservative versus surgical therapies. I encouraged her to continue all therapy regarding the plantar fasciitis including the medication by mouth. We also performed a curettage after local anesthesia was administered beneath the porokeratotic lesion forefoot right. She tolerated this procedure well. She was given both oral and written home-going instructions for care and soaking of her foot and I will follow-up with her in a week or so. We will send the small lesion for pathologic evaluation.   Remember to reevaluate plantar fasciitis right foot and capsulitis first metatarsophalangeal joint left foot.

## 2015-08-12 ENCOUNTER — Encounter (HOSPITAL_BASED_OUTPATIENT_CLINIC_OR_DEPARTMENT_OTHER): Payer: Self-pay | Admitting: *Deleted

## 2015-08-18 ENCOUNTER — Encounter (HOSPITAL_BASED_OUTPATIENT_CLINIC_OR_DEPARTMENT_OTHER)
Admission: RE | Admit: 2015-08-18 | Discharge: 2015-08-18 | Disposition: A | Discharge status: Home / Self Care | Payer: Medicare Other | Source: Ambulatory Visit | Attending: Surgery | Admitting: Surgery

## 2015-08-18 ENCOUNTER — Ambulatory Visit (AMBULATORY_SURGERY_CENTER): Payer: Self-pay

## 2015-08-18 VITALS — Ht 65.0 in | Wt 173.4 lb

## 2015-08-18 DIAGNOSIS — Z79899 Other long term (current) drug therapy: Secondary | ICD-10-CM | POA: Diagnosis not present

## 2015-08-18 DIAGNOSIS — Z1211 Encounter for screening for malignant neoplasm of colon: Secondary | ICD-10-CM

## 2015-08-18 DIAGNOSIS — N63 Unspecified lump in breast: Secondary | ICD-10-CM | POA: Diagnosis present

## 2015-08-18 DIAGNOSIS — Z7982 Long term (current) use of aspirin: Secondary | ICD-10-CM | POA: Diagnosis not present

## 2015-08-18 DIAGNOSIS — E7800 Pure hypercholesterolemia, unspecified: Secondary | ICD-10-CM | POA: Diagnosis not present

## 2015-08-18 DIAGNOSIS — I10 Essential (primary) hypertension: Secondary | ICD-10-CM | POA: Diagnosis not present

## 2015-08-18 DIAGNOSIS — D242 Benign neoplasm of left breast: Secondary | ICD-10-CM | POA: Diagnosis not present

## 2015-08-18 LAB — BASIC METABOLIC PANEL
ANION GAP: 9 (ref 5–15)
BUN: 14 mg/dL (ref 6–20)
CO2: 27 mmol/L (ref 22–32)
CREATININE: 0.97 mg/dL (ref 0.44–1.00)
Calcium: 9.5 mg/dL (ref 8.9–10.3)
Chloride: 102 mmol/L (ref 101–111)
GFR calc Af Amer: >60 mL/min (ref >60)
GFR calc non Af Amer: 59 mL/min — ABNORMAL LOW (ref >60)
Glucose, Bld: 120 mg/dL — ABNORMAL HIGH (ref 65–99)
POTASSIUM: 3.5 mmol/L (ref 3.5–5.1)
SODIUM: 138 mmol/L (ref 135–145)

## 2015-08-18 MED ORDER — NA SULFATE-K SULFATE-MG SULF 17.5-3.13-1.6 GM/177ML PO SOLN: ORAL | Status: DC

## 2015-08-18 NOTE — Progress Notes (Signed)
Pt was given  8 oz box of boost breese with instruction to drink by 0800 morning of surgery also given written instructions.  Pt instructed the boost was the only thing she could have after midnight no other liquid or food.  Pt voiced understanding

## 2015-08-18 NOTE — Progress Notes (Signed)
Per pt, no allergies to soy or egg products.Pt not taking any weight loss meds or using  O2 at home. 

## 2015-08-19 ENCOUNTER — Encounter: Payer: Medicare Other | Admitting: Gastroenterology

## 2015-08-19 DIAGNOSIS — D242 Benign neoplasm of left breast: Secondary | ICD-10-CM | POA: Diagnosis not present

## 2015-08-20 ENCOUNTER — Encounter (HOSPITAL_BASED_OUTPATIENT_CLINIC_OR_DEPARTMENT_OTHER): Payer: Self-pay | Admitting: *Deleted

## 2015-08-20 ENCOUNTER — Ambulatory Visit (HOSPITAL_BASED_OUTPATIENT_CLINIC_OR_DEPARTMENT_OTHER)
Admission: RE | Admit: 2015-08-20 | Discharge: 2015-08-20 | Disposition: A | Discharge status: Home / Self Care | Payer: Medicare Other | Source: Ambulatory Visit | Attending: Surgery | Admitting: Surgery

## 2015-08-20 ENCOUNTER — Ambulatory Visit (HOSPITAL_BASED_OUTPATIENT_CLINIC_OR_DEPARTMENT_OTHER): Payer: Medicare Other | Admitting: Anesthesiology

## 2015-08-20 ENCOUNTER — Encounter (HOSPITAL_BASED_OUTPATIENT_CLINIC_OR_DEPARTMENT_OTHER)
Admission: RE | Disposition: A | Discharge status: Home / Self Care | Payer: Self-pay | Source: Ambulatory Visit | Attending: Surgery

## 2015-08-20 DIAGNOSIS — Z7982 Long term (current) use of aspirin: Secondary | ICD-10-CM | POA: Diagnosis not present

## 2015-08-20 DIAGNOSIS — Z79899 Other long term (current) drug therapy: Secondary | ICD-10-CM | POA: Insufficient documentation

## 2015-08-20 DIAGNOSIS — I10 Essential (primary) hypertension: Secondary | ICD-10-CM | POA: Insufficient documentation

## 2015-08-20 DIAGNOSIS — D242 Benign neoplasm of left breast: Secondary | ICD-10-CM | POA: Insufficient documentation

## 2015-08-20 DIAGNOSIS — R928 Other abnormal and inconclusive findings on diagnostic imaging of breast: Secondary | ICD-10-CM | POA: Diagnosis not present

## 2015-08-20 DIAGNOSIS — E7800 Pure hypercholesterolemia, unspecified: Secondary | ICD-10-CM | POA: Diagnosis not present

## 2015-08-20 HISTORY — DX: Unspecified osteoarthritis, unspecified site: M19.90

## 2015-08-20 HISTORY — PX: BREAST LUMPECTOMY WITH RADIOACTIVE SEED LOCALIZATION: SHX6424

## 2015-08-20 SURGERY — BREAST LUMPECTOMY WITH RADIOACTIVE SEED LOCALIZATION
Anesthesia: General | Site: Breast | Laterality: Left

## 2015-08-20 MED ORDER — PROPOFOL 10 MG/ML IV BOLUS
INTRAVENOUS | Status: AC
  Filled 2015-08-20: qty 20

## 2015-08-20 MED ORDER — HYDROMORPHONE HCL 1 MG/ML IJ SOLN
INTRAMUSCULAR | Status: AC
  Filled 2015-08-20: qty 1

## 2015-08-20 MED ORDER — OXYCODONE HCL 5 MG PO TABS: 5.0000 mg | ORAL_TABLET | Freq: Once | ORAL | Status: DC | PRN

## 2015-08-20 MED ORDER — CEFAZOLIN SODIUM-DEXTROSE 2-4 GM/100ML-% IV SOLN
INTRAVENOUS | Status: AC
  Filled 2015-08-20: qty 100

## 2015-08-20 MED ORDER — ONDANSETRON HCL 4 MG/2ML IJ SOLN: 4.0000 mg | INTRAMUSCULAR | Status: DC | PRN

## 2015-08-20 MED ORDER — CEFAZOLIN SODIUM-DEXTROSE 2-4 GM/100ML-% IV SOLN
2.0000 g | INTRAVENOUS | Status: AC
  Administered 2015-08-20: 2 g via INTRAVENOUS

## 2015-08-20 MED ORDER — GLYCOPYRROLATE 0.2 MG/ML IJ SOLN: 0.2000 mg | Freq: Once | INTRAMUSCULAR | Status: DC | PRN

## 2015-08-20 MED ORDER — LIDOCAINE 2% (20 MG/ML) 5 ML SYRINGE
INTRAMUSCULAR | Status: AC
  Filled 2015-08-20: qty 5

## 2015-08-20 MED ORDER — DEXAMETHASONE SODIUM PHOSPHATE 4 MG/ML IJ SOLN
INTRAMUSCULAR | Status: DC | PRN
  Administered 2015-08-20: 10 mg via INTRAVENOUS

## 2015-08-20 MED ORDER — ATROPINE SULFATE 0.4 MG/ML IV SOSY
PREFILLED_SYRINGE | INTRAVENOUS | Status: DC | PRN
  Administered 2015-08-20: .4 mg via INTRAVENOUS

## 2015-08-20 MED ORDER — FENTANYL CITRATE (PF) 100 MCG/2ML IJ SOLN
50.0000 ug | INTRAMUSCULAR | Status: DC | PRN
  Administered 2015-08-20: 100 ug via INTRAVENOUS

## 2015-08-20 MED ORDER — HYDROCODONE-ACETAMINOPHEN 5-325 MG PO TABS: 1.0000 | ORAL_TABLET | ORAL | Status: DC | PRN

## 2015-08-20 MED ORDER — MORPHINE SULFATE (PF) 2 MG/ML IV SOLN: 2.0000 mg | INTRAVENOUS | Status: DC | PRN

## 2015-08-20 MED ORDER — CHLORHEXIDINE GLUCONATE 4 % EX LIQD: 1.0000 "application " | Freq: Once | CUTANEOUS | Status: DC

## 2015-08-20 MED ORDER — BUPIVACAINE HCL (PF) 0.5 % IJ SOLN
INTRAMUSCULAR | Status: AC
  Filled 2015-08-20: qty 30

## 2015-08-20 MED ORDER — PROPOFOL 10 MG/ML IV BOLUS
INTRAVENOUS | Status: DC | PRN
  Administered 2015-08-20: 200 mg via INTRAVENOUS

## 2015-08-20 MED ORDER — LIDOCAINE 2% (20 MG/ML) 5 ML SYRINGE
INTRAMUSCULAR | Status: DC | PRN
Start: 2015-08-20 — End: 2015-08-20
  Administered 2015-08-20: 80 mg via INTRAVENOUS

## 2015-08-20 MED ORDER — MEPERIDINE HCL 25 MG/ML IJ SOLN: 6.2500 mg | INTRAMUSCULAR | Status: DC | PRN

## 2015-08-20 MED ORDER — ONDANSETRON HCL 4 MG/2ML IJ SOLN
INTRAMUSCULAR | Status: DC | PRN
  Administered 2015-08-20: 4 mg via INTRAVENOUS

## 2015-08-20 MED ORDER — DEXAMETHASONE SODIUM PHOSPHATE 10 MG/ML IJ SOLN
INTRAMUSCULAR | Status: AC
  Filled 2015-08-20: qty 1

## 2015-08-20 MED ORDER — FENTANYL CITRATE (PF) 100 MCG/2ML IJ SOLN
INTRAMUSCULAR | Status: AC
  Filled 2015-08-20: qty 2

## 2015-08-20 MED ORDER — SCOPOLAMINE 1 MG/3DAYS TD PT72: 1.0000 | MEDICATED_PATCH | Freq: Once | TRANSDERMAL | Status: DC | PRN

## 2015-08-20 MED ORDER — OXYCODONE HCL 5 MG/5ML PO SOLN: 5.0000 mg | Freq: Once | ORAL | Status: DC | PRN

## 2015-08-20 MED ORDER — MIDAZOLAM HCL 2 MG/2ML IJ SOLN
INTRAMUSCULAR | Status: AC
  Filled 2015-08-20: qty 2

## 2015-08-20 MED ORDER — HYDROMORPHONE HCL 1 MG/ML IJ SOLN
0.2500 mg | INTRAMUSCULAR | Status: DC | PRN
  Administered 2015-08-20: 0.5 mg via INTRAVENOUS

## 2015-08-20 MED ORDER — ATROPINE SULFATE 0.4 MG/ML IV SOSY
PREFILLED_SYRINGE | INTRAVENOUS | Status: AC
  Filled 2015-08-20: qty 2.5

## 2015-08-20 MED ORDER — ONDANSETRON HCL 4 MG/2ML IJ SOLN
INTRAMUSCULAR | Status: AC
  Filled 2015-08-20: qty 2

## 2015-08-20 MED ORDER — MIDAZOLAM HCL 2 MG/2ML IJ SOLN
1.0000 mg | INTRAMUSCULAR | Status: DC | PRN
  Administered 2015-08-20: 2 mg via INTRAVENOUS

## 2015-08-20 MED ORDER — LACTATED RINGERS IV SOLN
INTRAVENOUS | Status: DC
  Administered 2015-08-20: 10:00:00 via INTRAVENOUS

## 2015-08-20 MED ORDER — BUPIVACAINE-EPINEPHRINE (PF) 0.25% -1:200000 IJ SOLN
INTRAMUSCULAR | Status: AC
  Filled 2015-08-20: qty 30

## 2015-08-20 MED ORDER — BUPIVACAINE-EPINEPHRINE 0.25% -1:200000 IJ SOLN
INTRAMUSCULAR | Status: DC | PRN
  Administered 2015-08-20: 10 mL

## 2015-08-20 MED ORDER — EPHEDRINE SULFATE-NACL 50-0.9 MG/10ML-% IV SOSY
PREFILLED_SYRINGE | INTRAVENOUS | Status: DC | PRN
  Administered 2015-08-20 (×2): 10 mg via INTRAVENOUS

## 2015-08-20 SURGICAL SUPPLY — 53 items
APPLIER CLIP 9.375 MED OPEN (MISCELLANEOUS) ×3
BENZOIN TINCTURE PRP APPL 2/3 (GAUZE/BANDAGES/DRESSINGS) ×3 IMPLANT
BLADE HEX COATED 2.75 (ELECTRODE) ×3 IMPLANT
BLADE SURG 15 STRL LF DISP TIS (BLADE) ×1 IMPLANT
BLADE SURG 15 STRL SS (BLADE) ×2
CANISTER SUC SOCK COL 7IN (MISCELLANEOUS) IMPLANT
CANISTER SUCT 1200ML W/VALVE (MISCELLANEOUS) ×3 IMPLANT
CHLORAPREP W/TINT 26ML (MISCELLANEOUS) ×3 IMPLANT
CLIP APPLIE 9.375 MED OPEN (MISCELLANEOUS) ×1 IMPLANT
CLOSURE WOUND 1/2 X4 (GAUZE/BANDAGES/DRESSINGS) ×1
COVER BACK TABLE 60X90IN (DRAPES) ×3 IMPLANT
COVER MAYO STAND STRL (DRAPES) ×3 IMPLANT
COVER PROBE W GEL 5X96 (DRAPES) ×3 IMPLANT
DECANTER SPIKE VIAL GLASS SM (MISCELLANEOUS) IMPLANT
DEVICE DUBIN W/COMP PLATE 8390 (MISCELLANEOUS) ×3 IMPLANT
DRAPE LAPAROTOMY 100X72 PEDS (DRAPES) ×3 IMPLANT
DRAPE UTILITY XL STRL (DRAPES) ×3 IMPLANT
DRSG TEGADERM 4X4.75 (GAUZE/BANDAGES/DRESSINGS) ×3 IMPLANT
ELECT REM PT RETURN 9FT ADLT (ELECTROSURGICAL) ×3
ELECTRODE REM PT RTRN 9FT ADLT (ELECTROSURGICAL) ×1 IMPLANT
GLOVE BIO SURGEON STRL SZ 6.5 (GLOVE) ×2 IMPLANT
GLOVE BIO SURGEON STRL SZ7 (GLOVE) ×3 IMPLANT
GLOVE BIO SURGEONS STRL SZ 6.5 (GLOVE) ×1
GLOVE BIOGEL PI IND STRL 7.0 (GLOVE) ×1 IMPLANT
GLOVE BIOGEL PI IND STRL 7.5 (GLOVE) ×1 IMPLANT
GLOVE BIOGEL PI INDICATOR 7.0 (GLOVE) ×2
GLOVE BIOGEL PI INDICATOR 7.5 (GLOVE) ×2
GLOVE SURG SS PI 7.0 STRL IVOR (GLOVE) ×3 IMPLANT
GOWN STRL REUS W/ TWL LRG LVL3 (GOWN DISPOSABLE) ×3 IMPLANT
GOWN STRL REUS W/TWL LRG LVL3 (GOWN DISPOSABLE) ×6
KIT MARKER MARGIN INK (KITS) ×3 IMPLANT
NEEDLE HYPO 25X1 1.5 SAFETY (NEEDLE) ×3 IMPLANT
NS IRRIG 1000ML POUR BTL (IV SOLUTION) ×3 IMPLANT
PACK BASIN DAY SURGERY FS (CUSTOM PROCEDURE TRAY) ×3 IMPLANT
PENCIL BUTTON HOLSTER BLD 10FT (ELECTRODE) ×3 IMPLANT
SLEEVE SCD COMPRESS KNEE MED (MISCELLANEOUS) ×3 IMPLANT
SPONGE GAUZE 2X2 8PLY STER LF (GAUZE/BANDAGES/DRESSINGS)
SPONGE GAUZE 2X2 8PLY STRL LF (GAUZE/BANDAGES/DRESSINGS) IMPLANT
SPONGE GAUZE 4X4 12PLY STER LF (GAUZE/BANDAGES/DRESSINGS) IMPLANT
SPONGE LAP 18X18 X RAY DECT (DISPOSABLE) IMPLANT
SPONGE LAP 4X18 X RAY DECT (DISPOSABLE) ×3 IMPLANT
STRIP CLOSURE SKIN 1/2X4 (GAUZE/BANDAGES/DRESSINGS) ×2 IMPLANT
SUT MON AB 4-0 PC3 18 (SUTURE) ×3 IMPLANT
SUT SILK 2 0 SH (SUTURE) IMPLANT
SUT VIC AB 3-0 SH 27 (SUTURE) ×2
SUT VIC AB 3-0 SH 27X BRD (SUTURE) ×1 IMPLANT
SYR BULB 3OZ (MISCELLANEOUS) IMPLANT
SYR CONTROL 10ML LL (SYRINGE) ×3 IMPLANT
TOWEL OR 17X24 6PK STRL BLUE (TOWEL DISPOSABLE) ×3 IMPLANT
TOWEL OR NON WOVEN STRL DISP B (DISPOSABLE) ×3 IMPLANT
TUBE CONNECTING 20'X1/4 (TUBING) ×1
TUBE CONNECTING 20X1/4 (TUBING) ×2 IMPLANT
YANKAUER SUCT BULB TIP NO VENT (SUCTIONS) ×3 IMPLANT

## 2015-08-20 NOTE — Discharge Instructions (Signed)
[facility] °Office Phone Number [phone] ° °BREAST BIOPSY/ PARTIAL MASTECTOMY: POST OP INSTRUCTIONS ° °Always review your discharge instruction sheet given to you by the facility where your surgery was performed. ° °IF YOU HAVE DISABILITY OR FAMILY LEAVE FORMS, YOU MUST BRING THEM TO THE OFFICE FOR PROCESSING.  DO NOT GIVE THEM TO YOUR DOCTOR. ° °1. A prescription for pain medication may be given to you upon discharge.  Take your pain medication as prescribed, if needed.  If narcotic pain medicine is not needed, then you may take acetaminophen (Tylenol) or ibuprofen (Advil) as needed. °2. Take your usually prescribed medications unless otherwise directed °3. If you need a refill on your pain medication, please contact your pharmacy.  They will contact our office to request authorization.  Prescriptions will not be filled after 5pm or on week-ends. °4. You should eat very light the first 24 hours after surgery, such as soup, crackers, pudding, etc.  Resume your normal diet the day after surgery. °5. Most patients will experience some swelling and bruising in the breast.  Ice packs and a good support bra will help.  Swelling and bruising can take several days to resolve.  °6. It is common to experience some constipation if taking pain medication after surgery.  Increasing fluid intake and taking a stool softener will usually help or prevent this problem from occurring.  A mild laxative (Milk of Magnesia or Miralax) should be taken according to package directions if there are no bowel movements after 48 hours. °7. Unless discharge instructions indicate otherwise, you may remove your bandages 48 hours after surgery, and you may shower at that time.  You will have steri-strips (small skin tapes) in place directly over the incision.  These strips should be left on the skin for 7-10 days.   Any sutures or staples will be removed at the office during your follow-up visit. °8. ACTIVITIES:  You may resume  regular daily activities (gradually increasing) beginning the next day.  Wearing a good support bra or sports bra minimizes pain and swelling.  You may have sexual intercourse when it is comfortable. °a. You may drive when you no longer are taking prescription pain medication, you can comfortably wear a seatbelt, and you can safely maneuver your car and apply brakes. °b. RETURN TO WORK:  1-2 weeks °9. You should see your doctor in the office for a follow-up appointment approximately two weeks after your surgery.  Your doctor’s nurse will typically make your follow-up appointment when she calls you with your pathology report.  Expect your pathology report 2-3 business days after your surgery.  You may call to check if you do not hear from us after three days. °10. OTHER INSTRUCTIONS: _______________________________________________________________________________________________ _____________________________________________________________________________________________________________________________________ °_____________________________________________________________________________________________________________________________________ °_____________________________________________________________________________________________________________________________________ ° °WHEN TO CALL YOUR DOCTOR: °1. Fever over 101.0 °2. Nausea and/or vomiting. °3. Extreme swelling or bruising. °4. Continued bleeding from incision. °5. Increased pain, redness, or drainage from the incision. ° °The clinic staff is available to answer your questions during regular business hours.  Please don’t hesitate to call and ask to speak to one of the nurses for clinical concerns.  If you have a medical emergency, go to the nearest emergency room or call 911.  A surgeon from [facility] is always on call at the hospital. ° °For further questions, please visit [email or web address]  ° ° ° °Post Anesthesia Home Care  Instructions ° °Activity: °Get plenty of rest for the remainder of the day. A responsible adult should stay   with you for 24 hours following the procedure.  °For the next 24 hours, DO NOT: °-Drive a car °-Operate machinery °-Drink alcoholic beverages °-Take any medication unless instructed by your physician °-Make any legal decisions or sign important papers. ° °Meals: °Start with liquid foods such as gelatin or soup. Progress to regular foods as tolerated. Avoid greasy, spicy, heavy foods. If nausea and/or vomiting occur, drink only clear liquids until the nausea and/or vomiting subsides. Call your physician if vomiting continues. ° °Special Instructions/Symptoms: °Your throat may feel dry or sore from the anesthesia or the breathing tube placed in your throat during surgery. If this causes discomfort, gargle with warm salt water. The discomfort should disappear within 24 hours. ° °If you had a scopolamine patch placed behind your ear for the management of post- operative nausea and/or vomiting: ° °1. The medication in the patch is effective for 72 hours, after which it should be removed.  Wrap patch in a tissue and discard in the trash. Wash hands thoroughly with soap and water. °2. You may remove the patch earlier than 72 hours if you experience unpleasant side effects which may include dry mouth, dizziness or visual disturbances. °3. Avoid touching the patch. Wash your hands with soap and water after contact with the patch. °  ° °

## 2015-08-20 NOTE — Op Note (Signed)
Pre-op Diagnosis: Left breast mass Post-op Diagnosis: Same Procedure: Left radioactive seed localized breast lumpectomy Surgeon: Savilla Turbyfill K. Anesthesia: Gen - LMA Indications: This is a 65 yo female who presents with several months of a palpable mass behind her left nipple.  Biopsy showed intraductal papilloma. A seed was placed yesterday and placement was confirmed in the holding area.  She presents now for lumpectomy.  The patient was brought to the operating room and placed in a supine position on the operating table. After an adequate level of anesthesia was obtained, her left breast was prepped with Chloraprep and draped in sterile fashion. A Time Out was held and the above information confirmed. The area of activity is in the lower outer quadrant. We drew a curvilinear circumareolar incision in this area. We made our incision and dissected down into the breast tissue with cautery. Using the neoprobe for guidance, we created four margins around the seed. The specimen was lifted and we amputated the mass after we confirmed adequate depth. The specimen was oriented with a paint kit. Specimen mammogram confirmed the presence of the clip and the radioactive seed. This was confirmed by radiology. We infiltrated the biopsy cavity with 0.25 % Marcaine with epinephrine.  We inspected for hemostasis. 3-0 Vicryl was used to close the subcutaneous tissues and 4-0 Monocryl was used to close the skin in subcuticular fashion. Benzoin and steri-strips were used to seal the incision. A clean dressing was applied. The patient was then extubated and brought to the recovery room in stable condition. All sponge, instrument, and needle counts were correct prior to closure and at the conclusion of the case.   Estimated Blood Loss: Minimal     Complications: None; patient tolerated the procedure well.   Disposition: PACU - hemodynamically stable.   Condition:  stable  Kelli Jones. [name], MD, [facility]  General/ Trauma Surgery  08/20/2015 11:10 AM

## 2015-08-20 NOTE — Anesthesia Postprocedure Evaluation (Signed)
Anesthesia Post Note  Patient: Kelli Jones  Procedure(s) Performed: Procedure(s) (LRB): BREAST LUMPECTOMY WITH RADIOACTIVE SEED LOCALIZATION (Left)  Patient location during evaluation: PACU Anesthesia Type: General Level of consciousness: awake and alert Pain management: pain level controlled Vital Signs Assessment: post-procedure vital signs reviewed and stable Respiratory status: spontaneous breathing, nonlabored ventilation and respiratory function stable Cardiovascular status: blood pressure returned to baseline and stable Postop Assessment: no signs of nausea or vomiting Anesthetic complications: no    Last Vitals:  Filed Vitals:   08/20/15 1200 08/20/15 1229  BP: 123/65 119/74  Pulse: 79 76  Temp:  36.4 C  Resp: 18 18    Last Pain:  Filed Vitals:   08/20/15 1231  PainSc: 2                  Darrel Baroni A

## 2015-08-20 NOTE — Anesthesia Preprocedure Evaluation (Addendum)
Anesthesia Evaluation  Patient identified by MRN, date of birth, ID band Patient awake    Reviewed: Allergy & Precautions, NPO status , Patient's Chart, lab work & pertinent test results  Airway Mallampati: I  TM Distance: >3 FB Neck ROM: Full    Dental  (+) Partial Lower   Pulmonary    breath sounds clear to auscultation       Cardiovascular hypertension, Pt. on medications and Pt. on home beta blockers  Rhythm:Regular Rate:Normal     Neuro/Psych    GI/Hepatic   Endo/Other    Renal/GU      Musculoskeletal   Abdominal   Peds  Hematology   Anesthesia Other Findings   Reproductive/Obstetrics                            Anesthesia Physical Anesthesia Plan  ASA: II  Anesthesia Plan: General   Post-op Pain Management:    Induction: Intravenous  Airway Management Planned: LMA  Additional Equipment:   Intra-op Plan:   Post-operative Plan: Extubation in OR  Informed Consent: I have reviewed the patients History and Physical, chart, labs and discussed the procedure including the risks, benefits and alternatives for the proposed anesthesia with the patient or authorized representative who has indicated his/her understanding and acceptance.   Dental advisory given  Plan Discussed with: CRNA, Surgeon and Anesthesiologist  Anesthesia Plan Comments:         Anesthesia Quick Evaluation

## 2015-08-20 NOTE — Transfer of Care (Signed)
Immediate Anesthesia Transfer of Care Note  Patient: Kelli Jones  Procedure(s) Performed: Procedure(s): BREAST LUMPECTOMY WITH RADIOACTIVE SEED LOCALIZATION (Left)  Patient Location: PACU  Anesthesia Type:General  Level of Consciousness: awake and patient cooperative  Airway & Oxygen Therapy: Patient Spontanous Breathing and Patient connected to face mask oxygen  Post-op Assessment: Report given to RN and Post -op Vital signs reviewed and stable  Post vital signs: Reviewed and stable  Last Vitals:  Filed Vitals:   08/20/15 0949 08/20/15 1117  BP: 138/69 104/57  Pulse: 70 89  Temp: 36.5 C   Resp: 18 20    Last Pain: There were no vitals filed for this visit.       Complications: No apparent anesthesia complications

## 2015-08-20 NOTE — H&P (View-Only) (Signed)
History of Present Illness Kelli Jones. Kelli Newstrom MD; 07/21/2015 11:53 AM) The patient is a 65 year old female who presents with a breast mass. Referred by Dr. Isaiah Jones for left breast intraductal papilloma PCP - Dr. Cathlean Jones  This is a 65 year old female who presents with a three-month history of a palpable mass behind her left areola. She had a left lumpectomy many years ago for benign mass. She underwent mammogram and ultrasound which showed a 7 mm intraductal mass. This was biopsied and returned a diagnosis of intraductal papilloma. She denies any nipple discharge. She denies any pain before the biopsy.  Menarche age 79 First pregnancy age 89 Breast-feed no Hormones no Family history negative   Other Problems Kelli Jones, CMA; 07/21/2015 10:20 AM) Anxiety Disorder Arthritis Back Pain Heart murmur High blood pressure Hypercholesterolemia  Past Surgical History Kelli Jones, CMA; 07/21/2015 10:20 AM) Breast Biopsy Left. Colon Polyp Removal - Colonoscopy Oral Surgery Tonsillectomy  Diagnostic Studies History Kelli Jones, CMA; 07/21/2015 10:20 AM) Colonoscopy >10 years ago Mammogram within last year Pap Smear 1-5 years ago  Allergies Kelli Jones, CMA; 07/21/2015 10:20 AM) No Known Drug Allergies 07/21/2015  Medication History Kelli Jones, CMA; 07/21/2015 10:21 AM) AmLODIPine Besylate (10MG  Tablet, Oral) Active. Aspirin (81MG  Tablet, Oral) Active. Ibuprofen (600MG  Tablet, Oral) Active. Losartan Potassium-HCTZ (100-25MG  Tablet, Oral) Active. Lovastatin (40MG  Tablet, Oral) Active. MethylPREDNISolone (4MG  Tab Ther Pack, Oral) Active. Potassium Chloride ER (10MEQ Tablet ER, Oral) Active. Valtrex (500MG  Tablet, Oral) Active. Medications Reconciled  Social History Kelli Jones, CMA; 07/21/2015 10:20 AM) Alcohol use Occasional alcohol use. Caffeine use Carbonated beverages, Coffee, Tea. No drug use Tobacco use Never smoker.  Family History  Kelli Jones, [place]; 07/21/2015 10:20 AM) Cancer Father. Diabetes Mellitus Mother, Son. Heart Disease Brother. Hypertension Brother.  Pregnancy / Birth History Kelli Jones, CMA; 07/21/2015 10:20 AM) Age at menarche 25 years. Age of menopause 55-60 Contraceptive History Intrauterine device, Oral contraceptives. Gravida 1 Maternal age 70-20 Para 1     Review of Systems Kelli Jones CMA; 07/21/2015 10:20 AM) General Present- Weight Gain. Not Present- Appetite Loss, Chills, Fatigue, Fever, Night Sweats and Weight Loss. Skin Not Present- Change in Wart/Mole, Dryness, Hives, Jaundice, New Lesions, Non-Healing Wounds, Rash and Ulcer. HEENT Present- Wears glasses/contact lenses. Not Present- Earache, Hearing Loss, Hoarseness, Nose Bleed, Oral Ulcers, Ringing in the Ears, Seasonal Allergies, Sinus Pain, Sore Throat, Visual Disturbances and Yellow Eyes. Respiratory Not Present- Bloody sputum, Chronic Cough, Difficulty Breathing, Snoring and Wheezing. Breast Present- Breast Mass. Not Present- Breast Pain, Nipple Discharge and Skin Changes. Cardiovascular Present- Leg Cramps. Not Present- Chest Pain, Difficulty Breathing Lying Down, Palpitations, Rapid Heart Rate, Shortness of Breath and Swelling of Extremities. Gastrointestinal Not Present- Abdominal Pain, Bloating, Bloody Stool, Change in Bowel Habits, Chronic diarrhea, Constipation, Difficulty Swallowing, Excessive gas, Gets full quickly at meals, Hemorrhoids, Indigestion, Nausea, Rectal Pain and Vomiting. Female Genitourinary Present- Frequency and Urgency. Not Present- Nocturia, Painful Urination and Pelvic Pain. Neurological Present- Decreased Memory, Headaches and Numbness. Not Present- Fainting, Seizures, Tingling, Tremor, Trouble walking and Weakness. Psychiatric Present- Anxiety. Not Present- Bipolar, Change in Sleep Pattern, Depression, Fearful and Frequent crying. Endocrine Not Present- Cold Intolerance, Excessive Hunger, Hair  Changes, Heat Intolerance, Hot flashes and New Diabetes. Hematology Not Present- Easy Bruising, Excessive bleeding, Gland problems, HIV and Persistent Infections.  Vitals Kelli Jones CMA; 07/21/2015 10:22 AM) 07/21/2015 10:21 AM Weight: 177 lb Height: 65in Body Surface Area: 1.88 m Body Mass Index: 29.45 kg/m  Temp.: 97.27F  Pulse: 71 (Regular)  BP: 126/76 (Sitting, Left Arm, Standard)      Physical Exam Kelli Jones K. Kelli Nusz MD; 07/21/2015 11:54 AM)  The physical exam findings are as follows: Note:WDWN in NAD HEENT: EOMI, sclera anicteric Neck: No masses, no thyromegaly Lungs: CTA bilaterally; normal respiratory effort Breasts: symmetric; no right breast masses or lymphadenopathy; no nipple abnormality Left breast - no lymphadenopathy or nipple abnormality Behind the lateral part of the areola at 3:00, there is a firm 1 cm palpable mass CV: Regular rate and rhythm; no murmurs Abd: +bowel sounds, soft, non-tender, no masses Ext: Well-perfused; no edema Skin: Warm, dry; no sign of jaundice    Assessment & Plan Kelli Jones K. Kelli Eads MD; 07/21/2015 11:03 AM)  Kelli Jones PAPILLOMA OF LEFT BREAST (D24.2)  Current Plans Schedule for Surgery - Left radioactive seed localized lumpectomy. The surgical procedure has been discussed with the patient. Potential risks, benefits, alternative treatments, and expected outcomes have been explained. All of the patient's questions at this time have been answered. The likelihood of reaching the patient's treatment goal is good. The patient understand the proposed surgical procedure and wishes to proceed.  Kelli Jones. Kelli Dover, MD, [facility]  General/ Trauma Surgery  07/21/2015 11:56 AM

## 2015-08-20 NOTE — Interval H&P Note (Signed)
History and Physical Interval Note:  08/20/2015 8:55 AM  Kelli Jones  has presented today for surgery, with the diagnosis of Left intraductal papilloma  The various methods of treatment have been discussed with the patient and family. After consideration of risks, benefits and other options for treatment, the patient has consented to  Procedure(s): BREAST LUMPECTOMY WITH RADIOACTIVE SEED LOCALIZATION (Left) as a surgical intervention .  The patient's history has been reviewed, patient examined, no change in status, stable for surgery.  I have reviewed the patient's chart and labs.  Questions were answered to the patient's satisfaction.     Naziah Weckerly K.

## 2015-08-20 NOTE — Anesthesia Procedure Notes (Signed)
Procedure Name: LMA Insertion Date/Time: 08/20/2015 10:28 AM Performed by: Lyndee Leo Pre-anesthesia Checklist: Patient identified, Emergency Drugs available, Suction available and Patient being monitored Patient Re-evaluated:Patient Re-evaluated prior to inductionOxygen Delivery Method: Circle System Utilized Preoxygenation: Pre-oxygenation with 100% oxygen Intubation Type: IV induction Ventilation: Mask ventilation without difficulty LMA: LMA inserted LMA Size: 4.0 Number of attempts: 1 Airway Equipment and Method: Bite block Placement Confirmation: positive ETCO2 Tube secured with: Tape Dental Injury: Teeth and Oropharynx as per pre-operative assessment

## 2015-08-21 ENCOUNTER — Encounter (HOSPITAL_BASED_OUTPATIENT_CLINIC_OR_DEPARTMENT_OTHER): Payer: Self-pay | Admitting: Surgery

## 2015-08-25 ENCOUNTER — Telehealth: Payer: Self-pay | Admitting: *Deleted

## 2015-08-27 ENCOUNTER — Ambulatory Visit: Payer: BC Managed Care – PPO | Admitting: Podiatry

## 2015-09-02 ENCOUNTER — Encounter: Payer: Medicare Other | Admitting: Gastroenterology

## 2015-09-02 ENCOUNTER — Encounter: Payer: Self-pay | Admitting: Gastroenterology

## 2015-09-02 ENCOUNTER — Ambulatory Visit (AMBULATORY_SURGERY_CENTER): Payer: Medicare Other | Admitting: Gastroenterology

## 2015-09-02 VITALS — BP 153/75 | HR 61 | Temp 96.9°F | Resp 12 | Ht 65.0 in | Wt 173.0 lb

## 2015-09-02 DIAGNOSIS — I10 Essential (primary) hypertension: Secondary | ICD-10-CM | POA: Diagnosis not present

## 2015-09-02 DIAGNOSIS — E669 Obesity, unspecified: Secondary | ICD-10-CM | POA: Diagnosis not present

## 2015-09-02 DIAGNOSIS — Z1211 Encounter for screening for malignant neoplasm of colon: Secondary | ICD-10-CM

## 2015-09-02 MED ORDER — SODIUM CHLORIDE 0.9 % IV SOLN: 500.0000 mL | INTRAVENOUS | Status: DC

## 2015-09-02 NOTE — Patient Instructions (Signed)
YOU HAD AN ENDOSCOPIC PROCEDURE TODAY AT [facility]:   Refer to the procedure report that was given to you for any specific questions about what was found during the examination.  If the procedure report does not answer your questions, please call your gastroenterologist to clarify.  If you requested that your care partner not be given the details of your procedure findings, then the procedure report has been included in a sealed envelope for you to review at your convenience later.  YOU SHOULD EXPECT: Some feelings of bloating in the abdomen. Passage of more gas than usual.  Walking can help get rid of the air that was put into your GI tract during the procedure and reduce the bloating. If you had a lower endoscopy (such as a colonoscopy or flexible sigmoidoscopy) you may notice spotting of blood in your stool or on the toilet paper. If you underwent a bowel prep for your procedure, you may not have a normal bowel movement for a few days.  Please Note:  You might notice some irritation and congestion in your nose or some drainage.  This is from the oxygen used during your procedure.  There is no need for concern and it should clear up in a day or so.  SYMPTOMS TO REPORT IMMEDIATELY:   Following lower endoscopy (colonoscopy or flexible sigmoidoscopy):  Excessive amounts of blood in the stool  Significant tenderness or worsening of abdominal pains  Swelling of the abdomen that is new, acute  Fever of 100F or higher   For urgent or emergent issues, a gastroenterologist can be reached at any hour by calling [phone].   DIET: Your first meal following the procedure should be a small meal and then it is ok to progress to your normal diet. Heavy or fried foods are harder to digest and may make you feel nauseous or bloated.  Likewise, meals heavy in dairy and vegetables can increase bloating.  Drink plenty of fluids but you should avoid alcoholic beverages for 24  hours.  ACTIVITY:  You should plan to take it easy for the rest of today and you should NOT DRIVE or use heavy machinery until tomorrow (because of the sedation medicines used during the test).    FOLLOW UP: Our staff will call the number listed on your records the next business day following your procedure to check on you and address any questions or concerns that you may have regarding the information given to you following your procedure. If we do not reach you, we will leave a message.  However, if you are feeling well and you are not experiencing any problems, there is no need to return our call.  We will assume that you have returned to your regular daily activities without incident.  If any biopsies were taken you will be contacted by phone or by letter within the next 1-3 weeks.  Please call us at [phone] if you have not heard about the biopsies in 3 weeks.    SIGNATURES/CONFIDENTIALITY: You and/or your care partner have signed paperwork which will be entered into your electronic medical record.  These signatures attest to the fact that that the information above on your After Visit Summary has been reviewed and is understood.  Full responsibility of the confidentiality of this discharge information lies with you and/or your care-partner.  Diverticulosis and high fiber diet information given.  Hemorrhoid information given.

## 2015-09-02 NOTE — Progress Notes (Signed)
Report to PACU, RN, vss, BBS= Clear.  

## 2015-09-02 NOTE — Op Note (Signed)
[facility] Patient Name: Kelli Jones Procedure Date: 09/02/2015 10:47 AM MRN: [id number] Endoscopist: Mauri Pole , MD Age: 65 Referring MD:  Date of Birth: May 15, 1950 Gender: Female Procedure:                Colonoscopy Indications:              Screening for colorectal malignant neoplasm,                            Screening for colorectal malignant neoplasm (last                            colonoscopy was 10 years ago) Medicines:                Monitored Anesthesia Care Procedure:                Pre-Anesthesia Assessment:                           - Prior to the procedure, a History and Physical                            was performed, and patient medications and                            allergies were reviewed. The patient's tolerance of                            previous anesthesia was also reviewed. The risks                            and benefits of the procedure and the sedation                            options and risks were discussed with the patient.                            All questions were answered, and informed consent                            was obtained. Prior Anticoagulants: The patient has                            taken no previous anticoagulant or antiplatelet                            agents. ASA Grade Assessment: II - A patient with                            mild systemic disease. After reviewing the risks                            and benefits, the patient was deemed in  satisfactory condition to undergo the procedure.                           After obtaining informed consent, the colonoscope                            was passed under direct vision. Throughout the                            procedure, the patient's blood pressure, pulse, and                            oxygen saturations were monitored continuously. The                            Model CF-[id number]) scope was introduced                            through the anus and advanced to the the terminal                            ileum, with identification of the appendiceal                            orifice and IC valve. The colonoscopy was performed                            without difficulty. The patient tolerated the                            procedure well. The quality of the bowel                            preparation was good. The terminal ileum, ileocecal                            valve, appendiceal orifice, and rectum were                            photographed. Scope In: 10:55:16 AM Scope Out: [id number] AM Scope Withdrawal Time: 0 hours 8 minutes 1 second  Total Procedure Duration: 0 hours 12 minutes 2 seconds  Findings:                 The perianal and digital rectal examinations were                            normal.                           Non-bleeding internal hemorrhoids were found during                            retroflexion. The hemorrhoids were small.  A few small-mouthed diverticula were found in the                            sigmoid colon and descending colon.                           The exam was otherwise without abnormality. Complications:            No immediate complications. Estimated Blood Loss:     Estimated blood loss: none. Impression:               - Non-bleeding internal hemorrhoids.                           - Diverticulosis in the sigmoid colon and in the                            descending colon.                           - The examination was otherwise normal.                           - No specimens collected. Recommendation:           - Patient has a contact number available for                            emergencies. The signs and symptoms of potential                            delayed complications were discussed with the                            patient. Return to normal activities tomorrow.                            Written  discharge instructions were provided to the                            patient.                           - Resume previous diet.                           - Continue present medications.                           - Await pathology results.                           - Repeat colonoscopy in 10 years for screening                            purposes.                           -  Return to GI clinic PRN. Mauri Pole, MD 09/02/2015 11:14:44 AM This report has been signed electronically.

## 2015-09-03 ENCOUNTER — Telehealth: Payer: Self-pay | Admitting: *Deleted

## 2015-09-03 NOTE — Telephone Encounter (Signed)
  Follow up Call-  Call back number 09/02/2015  Post procedure Call Back phone  # [phone]  Permission to leave phone message Yes     Patient questions:  Do you have a fever, pain , or abdominal swelling? No. Pain Score  0 *  Have you tolerated food without any problems? Yes.    Have you been able to return to your normal activities? Yes.    Do you have any questions about your discharge instructions: Diet   No. Medications  No. Follow up visit  No.  Do you have questions or concerns about your Care? No.  Actions: * If pain score is 4 or above: No action needed, pain <4.

## 2015-09-08 ENCOUNTER — Encounter (INDEPENDENT_AMBULATORY_CARE_PROVIDER_SITE_OTHER): Payer: BC Managed Care – PPO | Admitting: Podiatry

## 2015-09-08 NOTE — Progress Notes (Signed)
This encounter was created in error - please disregard.

## 2015-09-15 ENCOUNTER — Encounter: Payer: Self-pay | Admitting: Podiatry

## 2015-09-17 ENCOUNTER — Ambulatory Visit (INDEPENDENT_AMBULATORY_CARE_PROVIDER_SITE_OTHER): Payer: Medicare Other | Admitting: Podiatry

## 2015-09-17 ENCOUNTER — Encounter: Payer: Self-pay | Admitting: Podiatry

## 2015-09-17 DIAGNOSIS — Q828 Other specified congenital malformations of skin: Secondary | ICD-10-CM

## 2015-09-19 NOTE — Progress Notes (Signed)
She presents today for a follow-up of a surgical excision soft tissue lesion plantar aspect of the forefoot right just proximal to the fourth toe. She states that it feels a little better. She continues to soak regularly.  Objective: Vital signs are stable she's alert and oriented 3. Pulses are strongly palpable. She has no erythema edema cellulitis drainage or odor. The wound appears to be healing very nicely.  Assessment: Resolving surgical site for excision of wart.  Plan: Continue application of antibiotic cream or Vaseline to help soften the area. Should this become more painful she will notify S immediately.

## 2015-11-19 ENCOUNTER — Other Ambulatory Visit: Payer: Self-pay | Admitting: Internal Medicine

## 2016-01-26 ENCOUNTER — Ambulatory Visit (INDEPENDENT_AMBULATORY_CARE_PROVIDER_SITE_OTHER): Payer: Medicare Other | Admitting: Gynecology

## 2016-01-26 ENCOUNTER — Encounter: Payer: Self-pay | Admitting: Gynecology

## 2016-01-26 VITALS — BP 118/76 | Ht 65.0 in | Wt 176.0 lb

## 2016-01-26 DIAGNOSIS — Z01419 Encounter for gynecological examination (general) (routine) without abnormal findings: Secondary | ICD-10-CM

## 2016-01-26 DIAGNOSIS — N952 Postmenopausal atrophic vaginitis: Secondary | ICD-10-CM

## 2016-01-26 NOTE — Progress Notes (Signed)
    VERLAINE QUEENAN 1950/07/02 [id number]        65 y.o.  G1P1001  for breast and pelvic exam. Several issues noted below.  Past medical history,surgical history, problem list, medications, allergies, family history and social history were all reviewed and documented as reviewed in the EPIC chart.  ROS:  Performed with pertinent positives and negatives included in the history, assessment and plan.   Additional significant findings :  None   Exam: Caryn Bee assistant Vitals:   01/26/16 1036  BP: 118/76  Weight: 176 lb (79.8 kg)  Height: 5\' 5"  (1.651 m)   Body mass index is 29.29 kg/m.  General appearance:  Normal affect, orientation and appearance. Skin: Grossly normal HEENT: Without gross lesions.  No cervical or supraclavicular adenopathy. Thyroid normal.  Lungs:  Clear without wheezing, rales or rhonchi Cardiac: RR, without RMG Abdominal:  Soft, nontender, without masses, guarding, rebound, organomegaly or hernia Breasts:  Examined lying and sitting without masses, retractions, discharge or axillary adenopathy. Pelvic:  Ext, BUS, Vagina with atrophic changes  Cervix with atrophic changes  Uterus anteverted, normal size, shape and contour, midline and mobile nontender   Adnexa without masses or tenderness    Anus and perineum normal   Rectovaginal normal sphincter tone without palpated masses or tenderness.    Assessment/Plan:  65 y.o. G46P1001 female for breast and pelvic exam.   1. Postmenopausal/atrophic genital changes. No significant hot flushes, night sweats, vaginal dryness or any bleeding. Continue monitor report any issues or bleeding. 2. History of genital HSV. Uses Valtrex intermittently. Has supply at home but will call if needs more. 3. Mammography. Patient unclear when she is due for her next annual mammography. Will check with [facility] to confirm. Did have benign intraductal papilloma removed this past year. 4. DEXA 2015 normal. Plan repeat at 5 year  interval. 5. Colonoscopy 2017. Repeat at their recommended interval. 6. Pap smear 2016. No Pap smear done today. No history of significant abnormal Pap smears previously. 7. Health maintenance. No routine lab work done as this is done at her primary physician's office. Follow up 1 year, sooner as needed.   Anastasio Auerbach MD, 10:55 AM 01/26/2016

## 2016-01-26 NOTE — Patient Instructions (Signed)

## 2016-01-27 ENCOUNTER — Encounter: Payer: BC Managed Care – PPO | Admitting: Gynecology

## 2016-01-27 ENCOUNTER — Telehealth: Payer: Self-pay | Admitting: *Deleted

## 2016-01-27 NOTE — Telephone Encounter (Signed)
I called Kelli Jones and was told pt will due for mammogram in March 2018, pt aware.

## 2016-01-27 NOTE — Telephone Encounter (Signed)
-----   Message from Anastasio Auerbach, MD sent at 01/26/2016 10:57 AM EDT ----- Check with Teola Bradley to see when patient is due for her next mammography. Patient's unclear when she is due.

## 2016-08-30 ENCOUNTER — Other Ambulatory Visit: Payer: Self-pay | Admitting: Internal Medicine

## 2016-09-01 ENCOUNTER — Telehealth: Payer: Self-pay | Admitting: *Deleted

## 2016-09-01 NOTE — Telephone Encounter (Signed)
Left vm for patient to make appt °

## 2016-09-01 NOTE — Telephone Encounter (Signed)
Pt was transferred to triage stating she is needing medication refills. Pt hasn't seen MD since 06/2015 will need annual appt before refills can be given. Please contact pt to make appt...Johny Chess

## 2016-09-06 NOTE — Telephone Encounter (Signed)
Pt called and scheduled an appointment with Dr Jenny Reichmann. She said that she is completely out of the lovastatin (MEVACOR) 40 MG tablet and wanted to know if this could be sent over to get her though until her appointment.

## 2016-09-07 MED ORDER — LOVASTATIN 40 MG PO TABS: 40.0000 mg | ORAL_TABLET | Freq: Every day | ORAL | 0 refills | Status: DC

## 2016-09-07 NOTE — Telephone Encounter (Signed)
Sent 30 day script to pof until appt June 19th...Kelli Jones

## 2016-09-07 NOTE — Addendum Note (Signed)
Addended by: Earnstine Regal on: 09/07/2016 08:28 AM   Modules accepted: Orders

## 2016-09-08 ENCOUNTER — Emergency Department (HOSPITAL_BASED_OUTPATIENT_CLINIC_OR_DEPARTMENT_OTHER): Payer: Medicare Other

## 2016-09-08 ENCOUNTER — Encounter (HOSPITAL_BASED_OUTPATIENT_CLINIC_OR_DEPARTMENT_OTHER): Payer: Self-pay

## 2016-09-08 ENCOUNTER — Emergency Department (HOSPITAL_BASED_OUTPATIENT_CLINIC_OR_DEPARTMENT_OTHER)
Admission: EM | Admit: 2016-09-08 | Discharge: 2016-09-09 | Disposition: A | Discharge status: Home / Self Care | Payer: Medicare Other | Attending: Emergency Medicine | Admitting: Emergency Medicine

## 2016-09-08 DIAGNOSIS — Y999 Unspecified external cause status: Secondary | ICD-10-CM | POA: Insufficient documentation

## 2016-09-08 DIAGNOSIS — W19XXXA Unspecified fall, initial encounter: Secondary | ICD-10-CM

## 2016-09-08 DIAGNOSIS — S39012A Strain of muscle, fascia and tendon of lower back, initial encounter: Secondary | ICD-10-CM | POA: Insufficient documentation

## 2016-09-08 DIAGNOSIS — S3992XA Unspecified injury of lower back, initial encounter: Secondary | ICD-10-CM | POA: Diagnosis present

## 2016-09-08 DIAGNOSIS — Z7982 Long term (current) use of aspirin: Secondary | ICD-10-CM | POA: Diagnosis not present

## 2016-09-08 DIAGNOSIS — Y9301 Activity, walking, marching and hiking: Secondary | ICD-10-CM | POA: Diagnosis not present

## 2016-09-08 DIAGNOSIS — M25552 Pain in left hip: Secondary | ICD-10-CM | POA: Insufficient documentation

## 2016-09-08 DIAGNOSIS — W01198A Fall on same level from slipping, tripping and stumbling with subsequent striking against other object, initial encounter: Secondary | ICD-10-CM | POA: Diagnosis not present

## 2016-09-08 DIAGNOSIS — Y92524 Gas station as the place of occurrence of the external cause: Secondary | ICD-10-CM | POA: Diagnosis not present

## 2016-09-08 DIAGNOSIS — Z79899 Other long term (current) drug therapy: Secondary | ICD-10-CM | POA: Insufficient documentation

## 2016-09-08 NOTE — ED Notes (Signed)
ED Provider at bedside. 

## 2016-09-08 NOTE — ED Provider Notes (Addendum)
[facility] Provider Note   CSN: [id number] Arrival date & time: 09/08/16  2120 By signing my name below, I, Dyke Brackett, attest that this documentation has been prepared under the direction and in the presence of Fredia Sorrow, MD . Electronically Signed: Dyke Brackett, Scribe. 09/08/2016. 10:46 PM.   History   Chief Complaint Chief Complaint  Patient presents with  . Fall   HPI Kelli Jones is a 66 y.o. female who presents to the Emergency Department complaining of sudden onset, constant, severe pain to her s/p left proximal thigh pain mechanical fall at 6 pm tonight. Pt states she tripped over the gas hose and fell, twisting and landing on her left side. She reports associated abrasion to her right elbow, mid-right back and left hip pain. No OTC treatments tried for these symptoms PTA.  Pt has ambulated since the fall and is able to bear weight without difficulty. No LOC or head injury. She denies use of blood thinners. Pt denies  any ankle or wrist pain, right hip pain, any fever, chills, rhinorrhea, sore throat, cough, visual disturbance, SOB, CP, n/v/d, abdominal pain, dysuria, hematuria, joint swelling, rash, or headaches.    The history is provided by the patient. No language interpreter was used.  Fall  This is a new problem. The current episode started 3 to 5 hours ago. Pertinent negatives include no chest pain, no abdominal pain, no headaches and no shortness of breath. Nothing aggravates the symptoms. Nothing relieves the symptoms. She has tried nothing for the symptoms. The treatment provided no relief.    Past Medical History:  Diagnosis Date  . Arthritis    in hands  . Dyslipidemia   . Hypertension   . PID (pelvic inflammatory disease)    Tubal obstruction  . STD (sexually transmitted disease)    HSV    Patient Active Problem List   Diagnosis Date Noted  . Left leg pain 06/08/2015  . Corn of foot 06/08/2015  . Bradycardia 12/24/2014  . Impaired  glucose tolerance 07/06/2012  . STD (sexually transmitted disease)   . PID (pelvic inflammatory disease)   . Dyslipidemia   . Preventative health care 10/06/2011  . Essential hypertension 02/23/2007    Past Surgical History:  Procedure Laterality Date  . BREAST BIOPSY  1990   neg/  . BREAST LUMPECTOMY WITH RADIOACTIVE SEED LOCALIZATION Left 08/20/2015   Procedure: BREAST LUMPECTOMY WITH RADIOACTIVE SEED LOCALIZATION;  Surgeon: Donnie Mesa, MD;  Location: [facility];  Service: General;  Laterality: Left;  . DILATION AND CURETTAGE OF UTERUS  1979  . PELVIC LAPAROSCOPY     DL tubal lavage  . TONSILLECTOMY AND ADENOIDECTOMY      OB History    Gravida Para Term Preterm AB Living   1 1 1     1    SAB TAB Ectopic Multiple Live Births                   Home Medications    Prior to Admission medications   Medication Sig Start Date End Date Taking? Authorizing Provider  acyclovir ointment (ZOVIRAX) 5 % Apply 1 application topically every 3 (three) hours. 11/07/13   Biagio Borg, MD  amLODipine (NORVASC) 10 MG tablet Take 1 tablet (10 mg total) by mouth daily. 06/08/15   Biagio Borg, MD  aspirin 81 MG EC tablet Take 1 tablet (81 mg total) by mouth daily. Swallow whole. 07/06/12   Biagio Borg, MD  Biotin  w/ Vitamins C & E (HAIR/SKIN/NAILS PO) Take by mouth as needed.    [provider]  Calcium Carb-Cholecalciferol (CALCIUM + D3 PO) Take by mouth daily.    [provider]  ibuprofen (ADVIL,MOTRIN) 600 MG tablet take 1 tablet by mouth every 8 hours if needed 11/19/15   Biagio Borg, MD  losartan-hydrochlorothiazide (HYZAAR) 100-25 MG tablet Take 1 tablet by mouth daily.    [provider]  lovastatin (MEVACOR) 40 MG tablet Take 1 tablet (40 mg total) by mouth at bedtime. Must keep appt for future refills 09/07/16   Biagio Borg, MD  meloxicam (MOBIC) 15 MG tablet Take 1 tablet (15 mg total) by mouth daily. 07/07/15   Hyatt, Max T, DPM  Multiple  Vitamin (MULTIVITAMIN) tablet Take 1 tablet by mouth. Alive Women's MVI-Take one daily    [provider]  potassium chloride (K-DUR) 10 MEQ tablet Take 1 tablet (10 mEq total) by mouth daily. 12/24/14   Biagio Borg, MD  valACYclovir (VALTREX) 500 MG tablet Take 1 tablet (500 mg total) by mouth 2 (two) times daily. With outbreak 12/24/14   Biagio Borg, MD    Family History Family History  Problem Relation Age of Onset  . Kidney cancer Father   . Diabetes Mother   . Hypertension Mother   . Heart disease Brother     Social History Social History  Substance Use Topics  . Smoking status: Never Smoker  . Smokeless tobacco: Never Used  . Alcohol use 0.0 oz/week     Comment: Rare     Allergies   Patient has no known allergies.   Review of Systems Review of Systems  Constitutional: Negative for chills and fever.  HENT: Negative for rhinorrhea, sneezing and sore throat.   Eyes: Negative for visual disturbance.  Respiratory: Negative for cough and shortness of breath.   Cardiovascular: Negative for chest pain.  Gastrointestinal: Negative for abdominal pain, diarrhea, nausea and vomiting.  Genitourinary: Negative for dysuria and hematuria.  Musculoskeletal: Positive for back pain and myalgias. Negative for joint swelling and neck pain.  Skin: Positive for wound. Negative for rash.  Neurological: Negative for headaches.  Hematological: Does not bruise/bleed easily.  Psychiatric/Behavioral: Negative for confusion.   Physical Exam Updated Vital Signs BP 108/63   Pulse 75   Temp 98.3 F (36.8 C) (Oral)   Resp 20   Ht 1.626 m (5\' 4" )   Wt 72.6 kg (160 lb)   SpO2 100%   BMI 27.46 kg/m   Physical Exam  Constitutional: She is oriented to person, place, and time. She appears well-developed and well-nourished. No distress.  HENT:  Head: Normocephalic.  Mouth/Throat: Mucous membranes are normal.  Eyes: Conjunctivae and EOM are normal. Pupils are equal, round, and  reactive to light. No scleral icterus.  Neck: Neck supple.  Cardiovascular: Normal rate and regular rhythm.   Pulses:      Radial pulses are 2+ on the right side, and 2+ on the left side.  Pulmonary/Chest: Effort normal. No respiratory distress. She has no wheezes. She has no rales.  RA saturation 100%  Abdominal: Soft. Bowel sounds are normal. There is no tenderness.  Musculoskeletal: Normal range of motion. She exhibits no edema.  No ankle swelling. Rotation of left thigh medially and laterally doesn't illicit any pain.   Neurological: She is alert and oriented to person, place, and time. No cranial nerve deficit. She exhibits normal muscle tone. Coordination normal.  Skin: Skin is warm  and dry.  5 mm abrasion right elbow  Psychiatric: She has a normal mood and affect.  Nursing note and vitals reviewed.  ED Treatments / Results  DIAGNOSTIC STUDIES:  Oxygen Saturation is 100% on RA, normal by my interpretation.    COORDINATION OF CARE:  10:24 PM Discussed treatment plan with pt at bedside and pt agreed to plan.   Labs (all labs ordered are listed, but only abnormal results are displayed) Labs Reviewed - No data to display  EKG  EKG Interpretation  Date/Time:  Thursday September 08 2016 21:58:52 EDT Ventricular Rate:  75 PR Interval:    QRS Duration: 108 QT Interval:  396 QTC Calculation: 443 R Axis:   68 Text Interpretation:  Sinus rhythm Minimal ST elevation, anterior leads No previous ECGs available Confirmed by Fredia Sorrow [id number]) on 09/08/2016 10:18:21 PM       Radiology No results found.  Procedures Procedures (including critical care time)  Medications Ordered in ED Medications - No data to display   Initial Impression / Assessment and Plan / ED Course  I have reviewed the triage vital signs and the nursing notes.  Pertinent labs & imaging results that were available during my care of the patient were reviewed by me and considered in my medical decision  making (see chart for details).     Patient with fall at gas station. Tripped over a hose. Falling on her left side. Patient with complaint of left hip pain and upper lumbar back pain. No loss of consciousness. Also an abrasion to her right elbow.  CT lumbar area and x-rays of pelvis and hip pending.  Patient without any abdominal tenderness no shortness of breath no chest pain.  Final Clinical Impressions(s) / ED Diagnoses   Final diagnoses:  Fall, initial encounter  Lumbar strain, initial encounter  Hip pain, acute, left    New Prescriptions New Prescriptions   No medications on file   I personally performed the services described in this documentation, which was scribed in my presence. The recorded information has been reviewed and is accurate.      Fredia Sorrow, MD 09/09/16 0005   Patient returned over to the overnight physician pending CT and x-ray results officially by radiology. If they are negative patient's discharge has been completed.    Fredia Sorrow, MD 09/09/16 [phone]

## 2016-09-08 NOTE — ED Triage Notes (Signed)
Pt tripped over the gas tank hose and landed on left side.  C/o pain with movement, pain into her entire back.  Pt does not know if she hit her head, she is sweaty in triage and feels light headed and dizzy after ambulation.

## 2016-09-08 NOTE — ED Notes (Addendum)
"  Left thigh feels like a rubber band that is tightening up and it is not giving.  It is ten times worst when I walk than lying down here." "When I fell I twisted my body and the left side of my body hit the ground." Patient prefers to lay flat.  She stated that her middle back hurts.

## 2016-09-08 NOTE — ED Notes (Signed)
Patient transported to X-ray 

## 2016-09-09 MED ORDER — HYDROCODONE-ACETAMINOPHEN 5-325 MG PO TABS: 1.0000 | ORAL_TABLET | Freq: Four times a day (QID) | ORAL | 0 refills | Status: DC | PRN

## 2016-09-09 MED ORDER — HYDROCODONE-ACETAMINOPHEN 5-325 MG PO TABS
1.0000 | ORAL_TABLET | Freq: Once | ORAL | Status: AC
  Administered 2016-09-09: 1 via ORAL
  Filled 2016-09-09: qty 1

## 2016-09-09 NOTE — Discharge Instructions (Signed)
Take pain medicine as needed for pain. Follow-up with your doctor as needed. Expect to be stiff and sore for the next few days.

## 2016-09-20 ENCOUNTER — Ambulatory Visit (INDEPENDENT_AMBULATORY_CARE_PROVIDER_SITE_OTHER): Payer: Medicare Other | Admitting: Internal Medicine

## 2016-09-20 ENCOUNTER — Other Ambulatory Visit (INDEPENDENT_AMBULATORY_CARE_PROVIDER_SITE_OTHER): Payer: Medicare Other

## 2016-09-20 ENCOUNTER — Telehealth: Payer: Self-pay | Admitting: Emergency Medicine

## 2016-09-20 ENCOUNTER — Ambulatory Visit (HOSPITAL_COMMUNITY)
Admission: RE | Admit: 2016-09-20 | Discharge: 2016-09-20 | Disposition: A | Discharge status: Home / Self Care | Payer: Medicare Other | Source: Ambulatory Visit | Attending: Internal Medicine | Admitting: Internal Medicine

## 2016-09-20 ENCOUNTER — Other Ambulatory Visit: Payer: Self-pay | Admitting: Internal Medicine

## 2016-09-20 ENCOUNTER — Encounter: Payer: Self-pay | Admitting: Internal Medicine

## 2016-09-20 VITALS — BP 162/82 | HR 69 | Ht 64.0 in | Wt 172.0 lb

## 2016-09-20 DIAGNOSIS — M7989 Other specified soft tissue disorders: Secondary | ICD-10-CM | POA: Insufficient documentation

## 2016-09-20 DIAGNOSIS — R7302 Impaired glucose tolerance (oral): Secondary | ICD-10-CM

## 2016-09-20 DIAGNOSIS — I10 Essential (primary) hypertension: Secondary | ICD-10-CM | POA: Diagnosis not present

## 2016-09-20 DIAGNOSIS — M79605 Pain in left leg: Secondary | ICD-10-CM

## 2016-09-20 DIAGNOSIS — Z0001 Encounter for general adult medical examination with abnormal findings: Secondary | ICD-10-CM

## 2016-09-20 DIAGNOSIS — Z0000 Encounter for general adult medical examination without abnormal findings: Secondary | ICD-10-CM | POA: Diagnosis not present

## 2016-09-20 LAB — CBC WITH DIFFERENTIAL/PLATELET
BASOS ABS: 0.1 10*3/uL (ref 0.0–0.1)
Basophils Relative: 0.8 % (ref 0.0–3.0)
EOS ABS: 0.1 10*3/uL (ref 0.0–0.7)
Eosinophils Relative: 1.3 % (ref 0.0–5.0)
HEMATOCRIT: 34.2 % — AB (ref 36.0–46.0)
HEMOGLOBIN: 11.1 g/dL — AB (ref 12.0–15.0)
LYMPHS PCT: 31.7 % (ref 12.0–46.0)
Lymphs Abs: 2.5 10*3/uL (ref 0.7–4.0)
MCHC: 32.5 g/dL (ref 30.0–36.0)
MCV: 87.7 fl (ref 78.0–100.0)
MONO ABS: 0.6 10*3/uL (ref 0.1–1.0)
Monocytes Relative: 7.3 % (ref 3.0–12.0)
NEUTROS ABS: 4.7 10*3/uL (ref 1.4–7.7)
Neutrophils Relative %: 58.9 % (ref 43.0–77.0)
PLATELETS: 406 10*3/uL — AB (ref 150.0–400.0)
RBC: 3.9 Mil/uL (ref 3.87–5.11)
RDW: 15 % (ref 11.5–15.5)
WBC: 7.9 10*3/uL (ref 4.0–10.5)

## 2016-09-20 LAB — HEPATIC FUNCTION PANEL
ALT: 22 U/L (ref 0–35)
AST: 19 U/L (ref 0–37)
Albumin: 3.9 g/dL (ref 3.5–5.2)
Alkaline Phosphatase: 68 U/L (ref 39–117)
Bilirubin, Direct: 0 mg/dL (ref 0.0–0.3)
TOTAL PROTEIN: 7.1 g/dL (ref 6.0–8.3)
Total Bilirubin: 0.5 mg/dL (ref 0.2–1.2)

## 2016-09-20 LAB — URINALYSIS, ROUTINE W REFLEX MICROSCOPIC
Bilirubin Urine: NEGATIVE
HGB URINE DIPSTICK: NEGATIVE
Ketones, ur: NEGATIVE
Leukocytes, UA: NEGATIVE
Nitrite: NEGATIVE
PH: 5.5 (ref 5.0–8.0)
URINE GLUCOSE: NEGATIVE
UROBILINOGEN UA: 1 (ref 0.0–1.0)

## 2016-09-20 LAB — BASIC METABOLIC PANEL
BUN: 15 mg/dL (ref 6–23)
CHLORIDE: 107 meq/L (ref 96–112)
CO2: 29 meq/L (ref 19–32)
CREATININE: 1.02 mg/dL (ref 0.40–1.20)
Calcium: 9.2 mg/dL (ref 8.4–10.5)
GFR: 69.22 mL/min (ref >60.00)
Glucose, Bld: 96 mg/dL (ref 70–99)
POTASSIUM: 3.9 meq/L (ref 3.5–5.1)
Sodium: 141 mEq/L (ref 135–145)

## 2016-09-20 LAB — LIPID PANEL
CHOLESTEROL: 187 mg/dL (ref 0–200)
HDL: 50.7 mg/dL (ref >39.00)
LDL Cholesterol: 120 mg/dL — ABNORMAL HIGH (ref 0–99)
NonHDL: 135.94
TRIGLYCERIDES: 81 mg/dL (ref 0.0–149.0)
Total CHOL/HDL Ratio: 4
VLDL: 16.2 mg/dL (ref 0.0–40.0)

## 2016-09-20 LAB — TSH: TSH: 0.52 u[IU]/mL (ref 0.35–4.50)

## 2016-09-20 LAB — HEMOGLOBIN A1C: HEMOGLOBIN A1C: 6 % (ref 4.6–6.5)

## 2016-09-20 MED ORDER — LOVASTATIN 40 MG PO TABS: 40.0000 mg | ORAL_TABLET | Freq: Every day | ORAL | 3 refills | Status: DC

## 2016-09-20 MED ORDER — HYDROCODONE-ACETAMINOPHEN 5-325 MG PO TABS: 1.0000 | ORAL_TABLET | Freq: Four times a day (QID) | ORAL | 0 refills | Status: DC | PRN

## 2016-09-20 MED ORDER — CEPHALEXIN 500 MG PO CAPS: 500.0000 mg | ORAL_CAPSULE | Freq: Three times a day (TID) | ORAL | 0 refills | Status: AC

## 2016-09-20 MED ORDER — POTASSIUM CHLORIDE ER 10 MEQ PO TBCR: 10.0000 meq | EXTENDED_RELEASE_TABLET | Freq: Every day | ORAL | 3 refills | Status: DC

## 2016-09-20 MED ORDER — VALACYCLOVIR HCL 500 MG PO TABS: 500.0000 mg | ORAL_TABLET | Freq: Two times a day (BID) | ORAL | 12 refills | Status: DC

## 2016-09-20 MED ORDER — IBUPROFEN 600 MG PO TABS: ORAL_TABLET | ORAL | 0 refills | Status: DC

## 2016-09-20 MED ORDER — LOSARTAN POTASSIUM-HCTZ 100-25 MG PO TABS: 1.0000 | ORAL_TABLET | Freq: Every day | ORAL | 3 refills | Status: DC

## 2016-09-20 MED ORDER — AMLODIPINE BESYLATE 10 MG PO TABS: 10.0000 mg | ORAL_TABLET | Freq: Every day | ORAL | 3 refills | Status: DC

## 2016-09-20 NOTE — Assessment & Plan Note (Signed)

## 2016-09-20 NOTE — Patient Instructions (Signed)
Please take all new medication as prescribed - the pain medication  Please continue all other medications as before, and refills have been done if requested.  Please have the pharmacy call with any other refills you may need.  Please continue your efforts at being more active, low cholesterol diet, and weight control.  You are otherwise up to date with prevention measures today.  Please keep your appointments with your specialists as you may have planned  You will be contacted regarding the referral for: Left leg venous doppler test to make sure of blood clot in leg  Please go to the LAB in the Basement (turn left off the elevator) for the tests to be done today  You will be contacted by phone if any changes need to be made immediately.  Otherwise, you will receive a letter about your results with an explanation, but please check with MyChart first.  Please remember to sign up for MyChart if you have not done so, as this will be important to you in the future with finding out test results, communicating by private email, and scheduling acute appointments online when needed.  Please return in 1 year for your yearly visit, or sooner if needed, with Lab testing done 3-5 days before

## 2016-09-20 NOTE — Telephone Encounter (Signed)
[place] for ibuprofen refill  - done erx

## 2016-09-20 NOTE — Telephone Encounter (Signed)
Pt has been informed of results and script, expressed understanding.

## 2016-09-20 NOTE — Progress Notes (Signed)
**  Preliminary report by tech**  Left lower extremity venous duplex complete. There is no evidence of deep or superficial vein thrombosis involving the left lower extremity. All visualized vessels appear patent and compressible. There is no evidence of a Baker's cyst on the left. Results were given to [facility] at Dr. Gwynn Burly office.  09/20/16 2:31 PM Carlos Levering RVT

## 2016-09-20 NOTE — Telephone Encounter (Signed)
Pt has been taking Ibuprofen for the last couple weeks and it does not seem to help with the swelling. Is there something else she can try?

## 2016-09-20 NOTE — Assessment & Plan Note (Signed)
Assoc with the swelling for unclear reason, ok for vicodin limited refill today

## 2016-09-20 NOTE — Assessment & Plan Note (Addendum)
Need to r/o DVT but could be related to gravity effect of large contusion near left knee and medial thigh - f/ro LLE venous doppler  In addition to the time spent performing CPE, I spent an additional 15 minutes face to face,in which greater than 50% of this time was spent in counseling and coordination of care for patient's illness as documented., including the differential dx, evaluation and tx for left leg pain and swelling, as well as HTN and glucose management of overall control

## 2016-09-20 NOTE — Assessment & Plan Note (Signed)
Mod uncontrolled, to restart meds

## 2016-09-20 NOTE — Assessment & Plan Note (Signed)
Asympt, stable overall by history and exam, recent data reviewed with pt, and pt to continue medical treatment as before,  to f/u any worsening symptoms or concerns Lab Results  Component Value Date   HGBA1C 6.1 11/07/2013

## 2016-09-20 NOTE — Telephone Encounter (Signed)
Unfortunately no for this type of swelling, which will become much less over the next 2-3 more wks of healing

## 2016-09-20 NOTE — Telephone Encounter (Signed)
Received call with [place] report, Negative for DVT. Requesting RX for anti- inflammatory. Please advise.

## 2016-09-20 NOTE — Progress Notes (Signed)
Subjective:    Patient ID: Kelli Jones, female    DOB: 1950-08-08, 66 y.o.   MRN: [id number]  HPI  Here for wellness and f/u;  Overall doing ok;  Pt denies Chest pain, worsening SOB, DOE, wheezing, orthopnea, PND, palpitations, dizziness or syncope.  Pt denies neurological change such as new headache, facial or extremity weakness.  Pt denies polydipsia, polyuria, or low sugar symptoms. Pt states overall good compliance with treatment and medications, good tolerability, and has been trying to follow appropriate diet.  Pt denies worsening depressive symptoms, suicidal ideation or panic. No fever, night sweats, wt loss, loss of appetite, or other constitutional symptoms.  Pt states good ability with ADL's, has low fall risk, home safety reviewed and adequate, no other significant changes in hearing or vision, and only occasionally active with exercise, and actually none in the past 2 wks  Also seen at [facility] recently jun 7 after fall in injury leg with large contusion and whle swelling since then.  On ASA. Knee and leg xray per pt neg for acute per pt. Also recent has pelvis hip films jun 7 neg for acute.  CT lumbar June 7 neg for acute  . Seems to be healing, still with significant pain. Started 10/1-, now 6-7.  Out of pain medicaitons. No hx of DVT but states most of the leg swelling has happened in 3-4 days.  BP elevated due to pain and out of meds Past Medical History:  Diagnosis Date  . Arthritis    in hands  . Dyslipidemia   . Hypertension   . PID (pelvic inflammatory disease)    Tubal obstruction  . STD (sexually transmitted disease)    HSV   Past Surgical History:  Procedure Laterality Date  . BREAST BIOPSY  1990   neg/  . BREAST LUMPECTOMY WITH RADIOACTIVE SEED LOCALIZATION Left 08/20/2015   Procedure: BREAST LUMPECTOMY WITH RADIOACTIVE SEED LOCALIZATION;  Surgeon: Donnie Mesa, MD;  Location: [place];  Service: General;  Laterality: Left;  . DILATION AND CURETTAGE OF  UTERUS  1979  . PELVIC LAPAROSCOPY     DL tubal lavage  . TONSILLECTOMY AND ADENOIDECTOMY      reports that she has never smoked. She has never used smokeless tobacco. She reports that she drinks alcohol. She reports that she does not use drugs. family history includes Diabetes in her mother; Heart disease in her brother; Hypertension in her mother; Kidney cancer in her father. No Known Allergies Current Outpatient Prescriptions on File Prior to Visit  Medication Sig Dispense Refill  . acyclovir ointment (ZOVIRAX) 5 % Apply 1 application topically every 3 (three) hours. 3 g 5  . aspirin 81 MG EC tablet Take 1 tablet (81 mg total) by mouth daily. Swallow whole. 30 tablet 12  . Biotin w/ Vitamins C & E (HAIR/SKIN/NAILS PO) Take by mouth as needed.    . Calcium Carb-Cholecalciferol (CALCIUM + D3 PO) Take by mouth daily.    Marland Kitchen ibuprofen (ADVIL,MOTRIN) 600 MG tablet take 1 tablet by mouth every 8 hours if needed 60 tablet 0  . meloxicam (MOBIC) 15 MG tablet Take 1 tablet (15 mg total) by mouth daily. 30 tablet 3  . Multiple Vitamin (MULTIVITAMIN) tablet Take 1 tablet by mouth. Alive Women's MVI-Take one daily     No current facility-administered medications on file prior to visit.    Review of Systems Constitutional: Negative for other unusual diaphoresis, sweats, appetite or weight changes HENT: Negative for other  worsening hearing loss, ear pain, facial swelling, mouth sores or neck stiffness.   Eyes: Negative for other worsening pain, redness or other visual disturbance.  Respiratory: Negative for other stridor or swelling Cardiovascular: Negative for other palpitations or other chest pain  Gastrointestinal: Negative for worsening diarrhea or loose stools, blood in stool, distention or other pain Genitourinary: Negative for hematuria, flank pain or other change in urine volume.  Musculoskeletal: Negative for myalgias or other joint swelling. than above  Skin: Negative for other color  change, or other wound or worsening drainage.  Neurological: Negative for other syncope or numbness. Hematological: Negative for other adenopathy or swelling Psychiatric/Behavioral: Negative for hallucinations, other worsening agitation, SI, self-injury, or new decreased concentration All other system neg per pt    Objective:   Physical Exam BP (!) 162/82   Pulse 69   Ht 5\' 4"  (1.626 m)   Wt 172 lb (78 kg)   SpO2 100%   BMI 29.52 kg/m  VS noted,  Constitutional: Pt is oriented to person, place, and time. Appears well-developed and well-nourished, in no significant distress and comfortable Head: Normocephalic and atraumatic  Eyes: Conjunctivae and EOM are normal. Pupils are equal, round, and reactive to light Right Ear: External ear normal without discharge Left Ear: External ear normal without discharge Nose: Nose without discharge or deformity Mouth/Throat: Oropharynx is without other ulcerations and moist  Neck: Normal range of motion. Neck supple. No JVD present. No tracheal deviation present or significant neck LA or mass Cardiovascular: Normal rate, regular rhythm, normal heart sounds and intact distal pulses.   Pulmonary/Chest: WOB normal and breath sounds without rales or wheezing  Abdominal: Soft. Bowel sounds are normal. NT. No HSM  Musculoskeletal: Normal range of motion. Exhibits no edema except except for large contusion and bruising mostly to left medial knee and immediately distal but also extending to more proximal medial left thigh and calf with 1+ diffuse leg swelling, tender over bruising only, no other skin changes, redness, ulcer, Lymphadenopathy: Has no other cervical adenopathy.  Neurological: Pt is alert and oriented to person, place, and time. Pt has normal reflexes. No cranial nerve deficit. Motor grossly intact, Gait intact Skin: Skin is warm and dry. No rash noted or new ulcerations Psychiatric:  Has normal mood and affect. Behavior is normal without  agitation No other exam findings Lab Results  Component Value Date   WBC 7.1 06/08/2015   HGB 12.6 06/08/2015   HCT 38.1 06/08/2015   PLT 295.0 06/08/2015   GLUCOSE 120 (H) 08/18/2015   CHOL 201 (H) 06/08/2015   TRIG 87.0 06/08/2015   HDL 62.70 06/08/2015   LDLCALC 121 (H) 06/08/2015   ALT 29 06/08/2015   AST 27 06/08/2015   NA 138 08/18/2015   K 3.5 08/18/2015   CL 102 08/18/2015   CREATININE 0.97 08/18/2015   BUN 14 08/18/2015   CO2 27 08/18/2015   TSH 0.77 06/08/2015   HGBA1C 6.1 11/07/2013      Assessment & Plan:

## 2016-09-21 ENCOUNTER — Telehealth: Payer: Self-pay

## 2016-09-21 NOTE — Telephone Encounter (Signed)
Informed pt and she expressed understanding.  

## 2016-09-21 NOTE — Telephone Encounter (Signed)
-----   Message from Biagio Borg, MD sent at 09/20/2016  7:04 PM EDT ----- Left message on MyChart, pt to cont same tx except  The test results show that your current treatment is OK, except the urine test is consistent with possible infection.  We will send and antibiotic to your pharmacy, and you should be called from the office as well.    Saleen Peden to please inform pt, I will do rx

## 2016-09-21 NOTE — Telephone Encounter (Signed)
Pt has been informed and expressed understanding.  

## 2016-09-30 LAB — HM MAMMOGRAPHY

## 2017-01-16 NOTE — Telephone Encounter (Signed)
Entered in error

## 2017-02-07 ENCOUNTER — Encounter: Payer: BC Managed Care – PPO | Admitting: Gynecology

## 2017-03-03 ENCOUNTER — Encounter: Payer: BC Managed Care – PPO | Admitting: Gynecology

## 2017-04-06 ENCOUNTER — Encounter: Payer: Self-pay | Admitting: Gynecology

## 2017-04-06 ENCOUNTER — Ambulatory Visit: Payer: Medicare Other | Admitting: Gynecology

## 2017-04-06 VITALS — BP 122/80 | Ht 64.0 in | Wt 178.0 lb

## 2017-04-06 DIAGNOSIS — A609 Anogenital herpesviral infection, unspecified: Secondary | ICD-10-CM | POA: Diagnosis not present

## 2017-04-06 DIAGNOSIS — N952 Postmenopausal atrophic vaginitis: Secondary | ICD-10-CM

## 2017-04-06 DIAGNOSIS — Z01411 Encounter for gynecological examination (general) (routine) with abnormal findings: Secondary | ICD-10-CM | POA: Diagnosis not present

## 2017-04-06 MED ORDER — ACYCLOVIR 5 % EX OINT: 1.0000 "application " | TOPICAL_OINTMENT | CUTANEOUS | 5 refills | Status: DC

## 2017-04-06 NOTE — Patient Instructions (Signed)
Follow-up with your primary physician in reference to your racing.  Follow-up in 1 year for annual gynecologic exam.

## 2017-04-06 NOTE — Progress Notes (Signed)
    Kelli Jones 1950/09/30 [id number]        66 y.o.  G1P1001 for annual gynecologic exam.  Notes episodes where she feels like her heart is racing.  Feels that it is associated with stress.  No chest pain lightheadedness nausea or vomiting with this heart racing.  Past medical history,surgical history, problem list, medications, allergies, family history and social history were all reviewed and documented as reviewed in the EPIC chart.  ROS:  Performed with pertinent positives and negatives included in the history, assessment and plan.   Additional significant findings : None   Exam: Caryn Bee assistant Vitals:   04/06/17 1135  BP: 122/80  Weight: 178 lb (80.7 kg)  Height: 5\' 4"  (1.626 m)   Body mass index is 30.55 kg/m.  General appearance:  Normal affect, orientation and appearance. Skin: Grossly normal HEENT: Without gross lesions.  No cervical or supraclavicular adenopathy. Thyroid normal.  Lungs:  Clear without wheezing, rales or rhonchi Cardiac: RR, without RMG Abdominal:  Soft, nontender, without masses, guarding, rebound, organomegaly or hernia Breasts:  Examined lying and sitting without masses, retractions, discharge or axillary adenopathy. Pelvic:  Ext, BUS, Vagina: With atrophic changes  Cervix: With atrophic changes  Uterus: Anteverted, normal size, shape and contour, midline and mobile nontender   Adnexa: Without masses or tenderness    Anus and perineum: Normal   Rectovaginal: Normal sphincter tone without palpated masses or tenderness.    Assessment/Plan:  67 y.o. G55P1001 female for annual gynecologic exam.   1. Postmenopausal/atrophic genital changes.  No significant hot flushes, night sweats, vaginal dryness or any vaginal bleeding.  Report any issues or bleeding. 2. Heart racing.  No associated symptoms.  Recommended follow-up with primary physician for further evaluation and she agrees to arrange this. 3. Mammography 09/2016.  Continue with annual  mammography when due.  SBE monthly reviewed.  Breast exam normal today. 4. DEXA 2015 normal.  Plan repeat DEXA at 5-year interval. 5. Colonoscopy 2017.  Repeat at their recommended interval. 6. Pap smear 2016.  No Pap smear done today.  No history of abnormal Pap smears.  Options to stop screening per current screening guidelines versus less frequent screening intervals to include repeat Pap smear next year at 3-year interval reviewed.  Will readdress on annual basis. 7. Health maintenance.  No routine lab work done as patient does this elsewhere.  Follow-up 1 year, sooner as needed.   Anastasio Auerbach MD, 11:58 AM 04/06/2017

## 2017-04-10 ENCOUNTER — Ambulatory Visit: Payer: Self-pay | Admitting: *Deleted

## 2017-04-10 NOTE — Telephone Encounter (Signed)
Pt states she has been experiencing heaviness in chest(denies pain), shortness of breath and blurry vision for about 6 weeks. Pt states she feels like she has to take a deep breath "to let oxygen in" and feels like her heart is racing. Pt advised that she needed to be treated in the ED now. Pt states that she can not go now but will go in the afternoon. Pt encouraged to go to the ED now to be evaluated for current symptoms. Pt verbalized understanding.  Reason for Disposition . Difficulty breathing  Answer Assessment - Initial Assessment Questions 1. LOCATION: "Where does it hurt?"       Middle of chest 2. RADIATION: "Does the pain go anywhere else?" (e.g., into neck, jaw, arms, back)     No 3. ONSET: "When did the chest pain begin?" (Minutes, hours or days)      About 6 weeks ago 4. PATTERN "Does the pain come and go, or has it been constant since it started?"  "Does it get worse with exertion?"      Gets better with exercise, Comes and goes but is there about 70% of the time 5. DURATION: "How long does it last" (e.g., seconds, minutes, hours)     Pt states she has to take a deep breath to let oxygen in 6. SEVERITY: "How bad is the pain?"  (e.g., Scale 1-10; mild, moderate, or severe)    - MILD (1-3): doesn't interfere with normal activities     - MODERATE (4-7): interferes with normal activities or awakens from sleep    - SEVERE (8-10): excruciating pain, unable to do any normal activities       No pain, just heaviness 7. CARDIAC RISK FACTORS: "Do you have any history of heart problems or risk factors for heart disease?" (e.g., prior heart attack, angina; high blood pressure, diabetes, being overweight, high cholesterol, smoking, or strong family history of heart disease)     HTN 8. PULMONARY RISK FACTORS: "Do you have any history of lung disease?"  (e.g., blood clots in lung, asthma, emphysema, birth control pills)     No 9. CAUSE: "What do you think is causing the chest pain?"      Unsure 10. OTHER SYMPTOMS: "Do you have any other symptoms?" (e.g., dizziness, nausea, vomiting, sweating, fever, difficulty breathing, cough)       Difficulty breathing, blurry vision  Protocols used: CHEST PAIN-A-AH

## 2017-08-12 IMAGING — CT CT L SPINE W/O CM
3 of 10 series · 12 of 33 positions shown, 14 images · non-contrast
Comparison: None.

CLINICAL DATA: Initial evaluation for acute trauma, fall, low back
pain with left thigh pain.

EXAM:
CT LUMBAR SPINE WITHOUT CONTRAST
TECHNIQUE: Multidetector CT imaging of the lumbar spine was performed without
intravenous contrast administration. Multiplanar CT image
reconstructions were also generated.

[Series 4: l spine soft · axial · 0.26mm/px · z∈[-266,-106]mm · 4 of 122 slices shown, 5 images]
[im 21/122  soft-tissue]
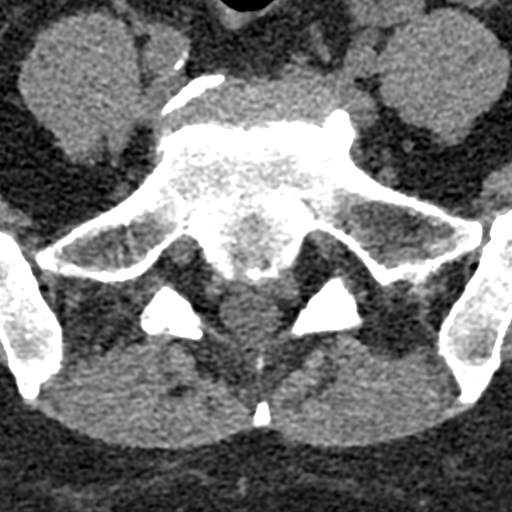
[im 21/122  bone]
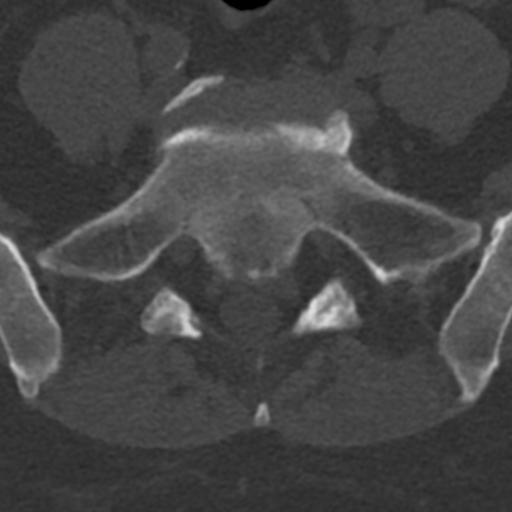
[im 41/122  bone]
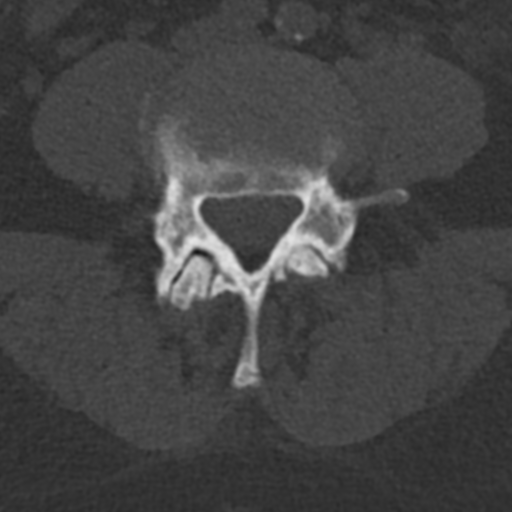
[im 81/122  bone]
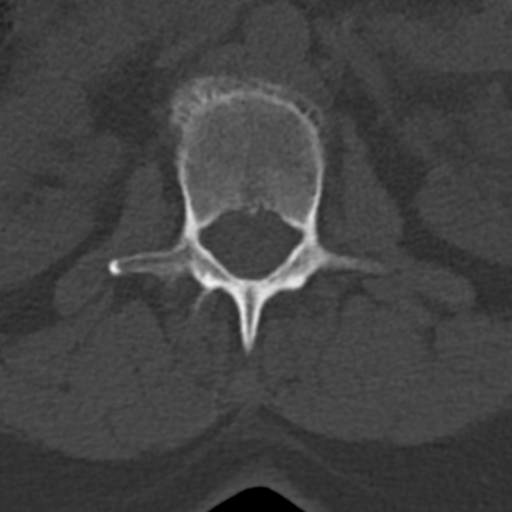
[im 101/122  bone]
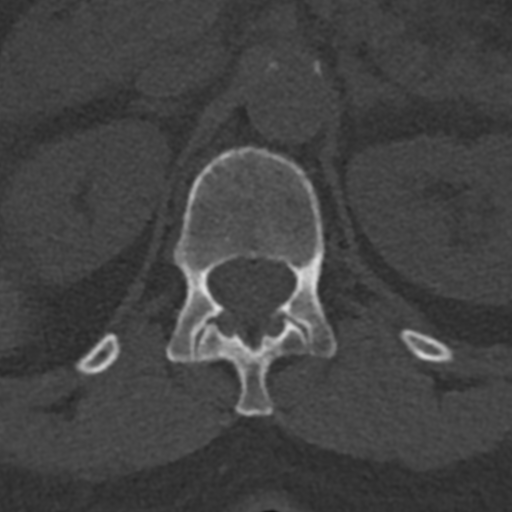

[Series 5: sagittal bone · sagittal · 0.35mm/px · 5 of 39 slices shown, 6 images]
[im 13/39  bone]
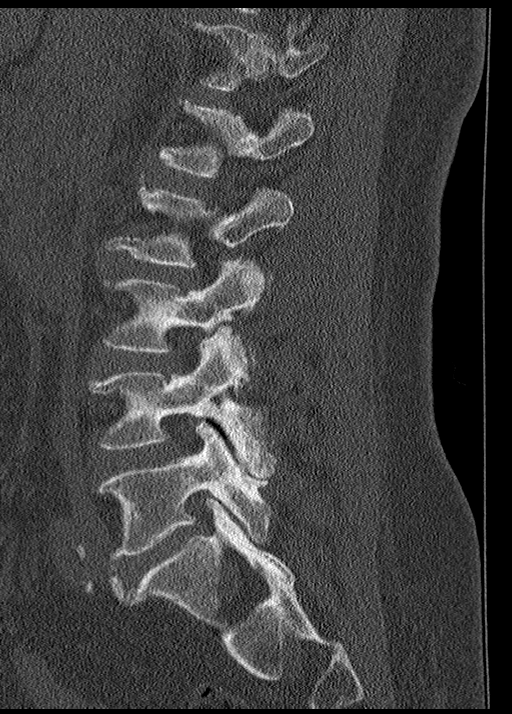
[im 16/39  bone]
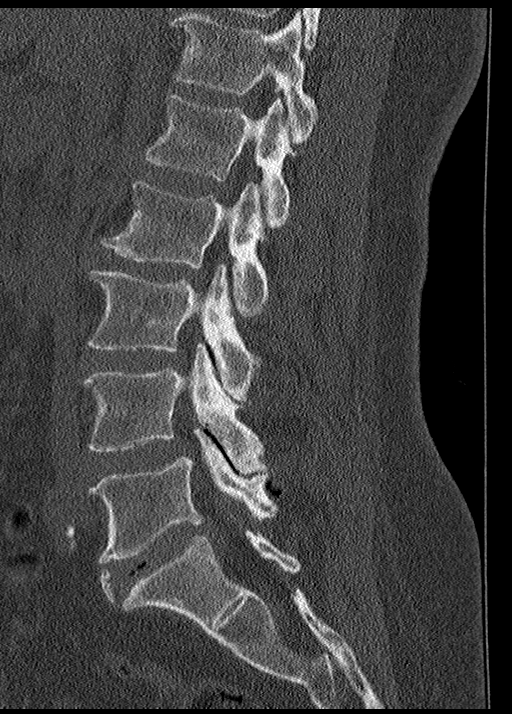
[im 20/39  soft-tissue]
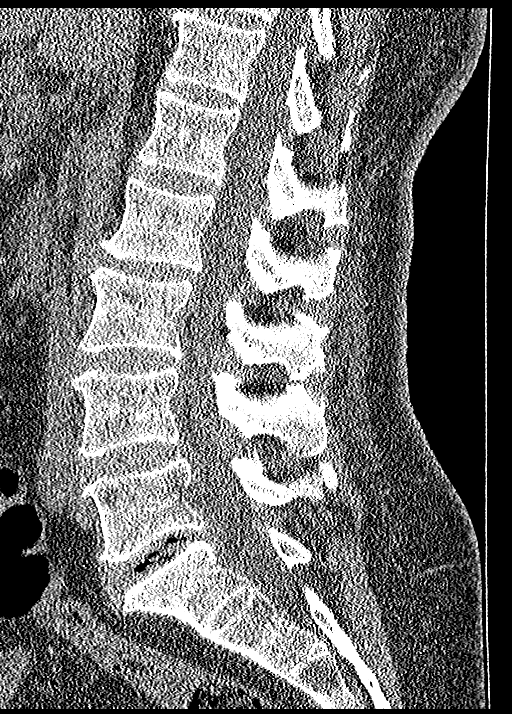
[im 20/39  bone]
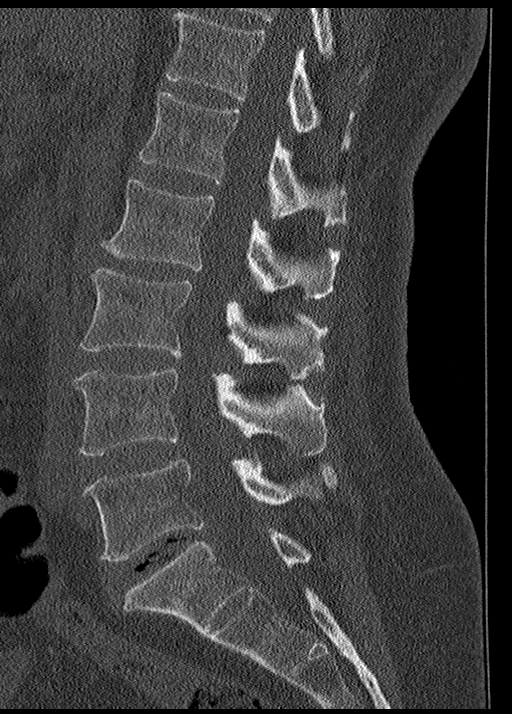
[im 23/39  bone]
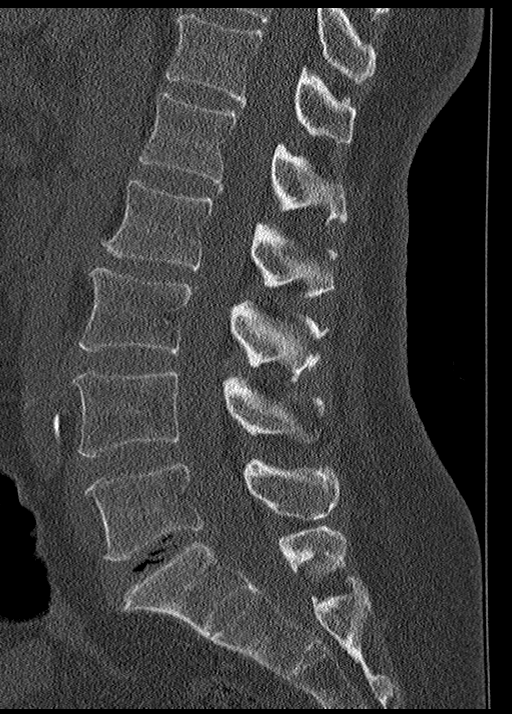
[im 26/39  bone]
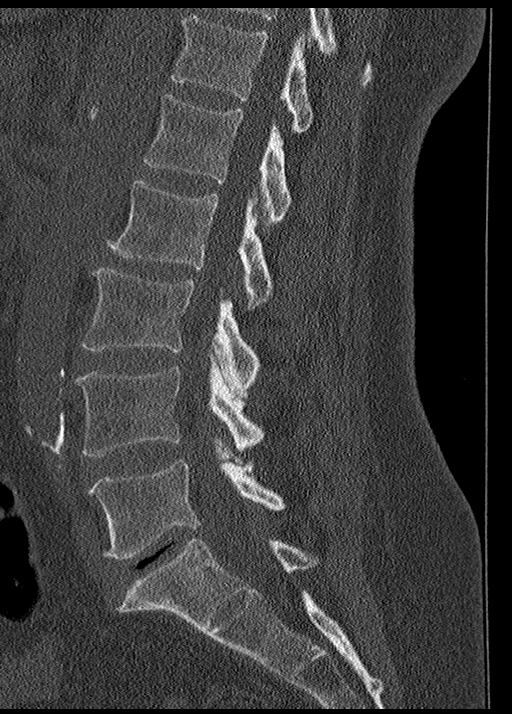

[Series 6: coronal upper · coronal · 0.33mm/px · 3 of 61 slices shown]
[im 16/61  bone]
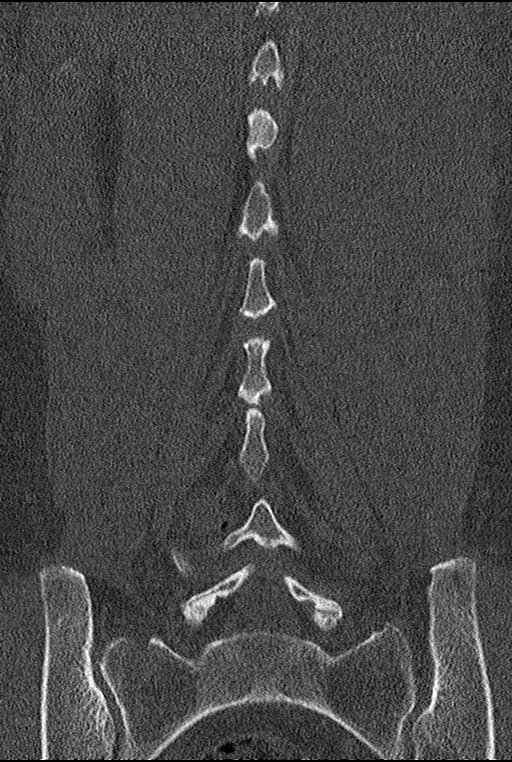
[im 31/61  bone]
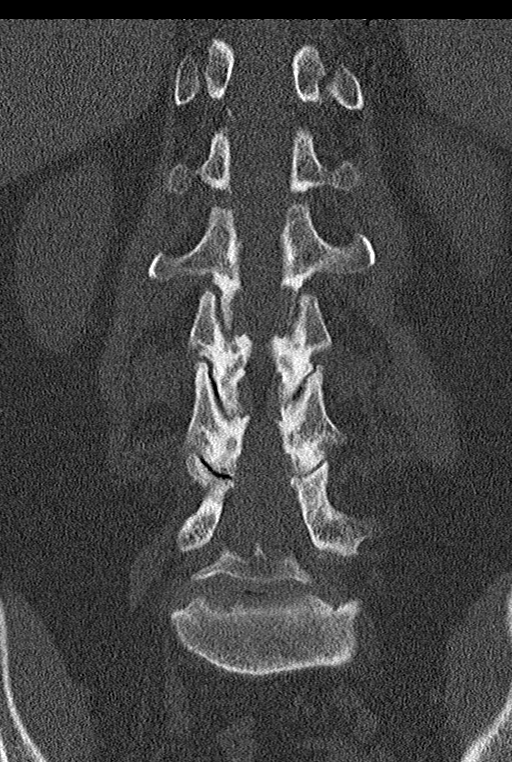
[im 46/61  bone]
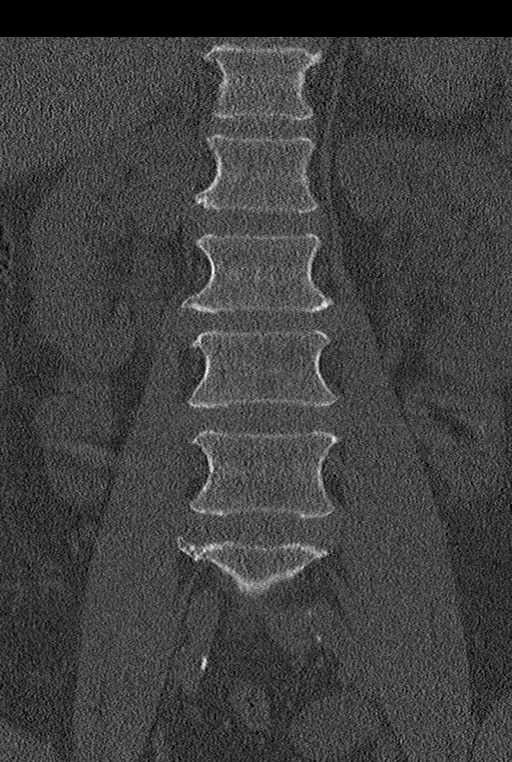

[12 of 33 positions shown; findings below may reference images not displayed]

FINDINGS: Segmentation: Normal segmentation. Lowest well-formed disc is
labeled the L5-S1 level.

Alignment: Vertebral bodies normally aligned with preservation of
the normal lumbar lordosis. No listhesis.

Vertebrae: Vertebral body heights are maintained. No acute fracture.
Visualized sacrum intact. SI joints approximated. No focal osseous
lesions. Subtle linear lucency through the left transverse process
of L3 favored to reflect a normal nutrient foramen.

Paraspinal and other soft tissues: Paraspinous soft tissues within
normal limits. Moderate aorto bi-iliac atherosclerotic disease.
Possible mild left hydroureteronephrosis, poorly evaluated on this
exam. Possible punctate 2 mm left renal calculus (series 4, image
39). Aorto bi-iliac atherosclerotic disease.

Disc levels:

L1-2:  Unremarkable.

L2-3: Diffuse degenerative disc bulge. Mild facet hypertrophy. No
significant stenosis.

L3-4: Mild disc bulge. Mild bilateral facet arthrosis. No
significant canal or foraminal stenosis.

L4-5: Diffuse degenerative disc bulge, slightly eccentric to the
right. Moderate bilateral facet arthrosis, worse on the right. No
significant canal narrowing. Foramina remain patent.

L5-S1: Diffuse degenerative disc bulge with disc desiccation.
Bulging disc closely approximates the descending S1 nerve roots
without frank neural impingement. Moderate bilateral facet
arthrosis, worse on the left. Probable mild bilateral L5 foraminal
narrowing.
IMPRESSION: 1. No acute traumatic injury within the lumbar spine.
2. Degenerative disc bulge at L5-S1, closely approximating and
potentially irritating either of the descending S1 nerve roots.
3. Multilevel facet arthropathy as above, greatest at L4-5 and
L5-S1.
4. Question mild left hydroureteronephrosis with possible 2 mm
nonobstructive left renal calculus. Correlation with urinalysis
recommended. Additionally, this could be further assessed with
dedicated cross-sectional imaging as indicated.

## 2017-11-02 ENCOUNTER — Ambulatory Visit: Payer: Medicare Other

## 2017-11-17 ENCOUNTER — Ambulatory Visit: Payer: Medicare Other | Admitting: Internal Medicine

## 2017-11-17 ENCOUNTER — Encounter: Payer: Self-pay | Admitting: Internal Medicine

## 2017-11-17 VITALS — BP 124/62 | HR 82 | Temp 98.4°F | Ht 64.0 in | Wt 175.8 lb

## 2017-11-17 DIAGNOSIS — I10 Essential (primary) hypertension: Secondary | ICD-10-CM

## 2017-11-17 DIAGNOSIS — R7302 Impaired glucose tolerance (oral): Secondary | ICD-10-CM | POA: Diagnosis not present

## 2017-11-17 DIAGNOSIS — M545 Low back pain, unspecified: Secondary | ICD-10-CM

## 2017-11-17 DIAGNOSIS — G8929 Other chronic pain: Secondary | ICD-10-CM | POA: Diagnosis not present

## 2017-11-17 MED ORDER — MELOXICAM 15 MG PO TABS: 15.0000 mg | ORAL_TABLET | Freq: Every day | ORAL | 5 refills | Status: DC

## 2017-11-17 MED ORDER — CYCLOBENZAPRINE HCL 5 MG PO TABS: 5.0000 mg | ORAL_TABLET | Freq: Three times a day (TID) | ORAL | 1 refills | Status: AC | PRN

## 2017-11-17 NOTE — Patient Instructions (Signed)
Please take all new medication as prescribed - the anti-inflammatory (mobic), and muscle relaxer as needed  Please continue all other medications as before, and refills have been done if requested.  Please have the pharmacy call with any other refills you may need.  Please continue your efforts at being more active, low cholesterol diet, and weight control.  You are otherwise up to date with prevention measures today.  Please keep your appointments with your specialists as you may have planned  You will be contacted regarding the referral for: Orthopedic - [email or web address]

## 2017-11-17 NOTE — Progress Notes (Signed)
Subjective:    Patient ID: Kelli Jones, female    DOB: [birth year], 67 y.o.   MRN: [id number]  HPI  Here to f/u with c/o 3 months persistent low back pain, constant, mild to occasionally severe for short time, but no bowel or bladder change, fever, wt loss,  worsening LE pain/numbness/weakness, gait change or falls.  Worse to bend laterally especially to the left with some radiation, nothing else makes better or worse.  Denies urinary symptoms such as dysuria, frequency, urgency, flank pain, hematuria or n/v, fever, chills.  Denies worsening reflux, abd pain, dysphagia, n/v, bowel change or blood.  Also with tenderness to right lateral knee joint line only with deep knee bends while standing, no swelling, just wondering if related. Pt denies other new neurological symptoms such as new headache, or facial or extremity weakness or numbness   Pt denies polydipsia, polyuria Past Medical History:  Diagnosis Date  . Arthritis    in hands  . Dyslipidemia   . Hypertension   . PID (pelvic inflammatory disease)    Tubal obstruction  . STD (sexually transmitted disease)    HSV   Past Surgical History:  Procedure Laterality Date  . BREAST BIOPSY  1990   neg/  . BREAST LUMPECTOMY WITH RADIOACTIVE SEED LOCALIZATION Left 08/20/2015   Procedure: BREAST LUMPECTOMY WITH RADIOACTIVE SEED LOCALIZATION;  Surgeon: Donnie Mesa, MD;  Location: [facility];  Service: General;  Laterality: Left;  . DILATION AND CURETTAGE OF UTERUS  1979  . PELVIC LAPAROSCOPY     DL tubal lavage  . TONSILLECTOMY AND ADENOIDECTOMY      reports that she has never smoked. She has never used smokeless tobacco. She reports that she drinks alcohol. She reports that she does not use drugs. family history includes Diabetes in her mother; Heart disease in her brother; Hypertension in her mother; Kidney cancer in her father. No Known Allergies Current Outpatient Medications on File Prior to Visit  Medication Sig  Dispense Refill  . acyclovir ointment (ZOVIRAX) 5 % Apply 1 application topically every 3 (three) hours. 3 g 5  . amLODipine (NORVASC) 10 MG tablet Take 1 tablet (10 mg total) by mouth daily. 90 tablet 3  . aspirin 81 MG EC tablet Take 1 tablet (81 mg total) by mouth daily. Swallow whole. 30 tablet 12  . Biotin w/ Vitamins C & E (HAIR/SKIN/NAILS PO) Take by mouth as needed.    . Calcium Carb-Cholecalciferol (CALCIUM + D3 PO) Take by mouth daily.    Marland Kitchen losartan-hydrochlorothiazide (HYZAAR) 100-25 MG tablet Take 1 tablet by mouth daily. 90 tablet 3  . lovastatin (MEVACOR) 40 MG tablet Take 1 tablet (40 mg total) by mouth at bedtime. Must keep appt for future refills 90 tablet 3  . Multiple Vitamin (MULTIVITAMIN) tablet Take 1 tablet by mouth. Alive Women's MVI-Take one daily    . potassium chloride (K-DUR) 10 MEQ tablet Take 1 tablet (10 mEq total) by mouth daily. 90 tablet 3  . valACYclovir (VALTREX) 500 MG tablet Take 1 tablet (500 mg total) by mouth 2 (two) times daily. With outbreak 60 tablet 12   No current facility-administered medications on file prior to visit.    Review of Systems  Constitutional: Negative for other unusual diaphoresis or sweats HENT: Negative for ear discharge or swelling Eyes: Negative for other worsening visual disturbances Respiratory: Negative for stridor or other swelling  Gastrointestinal: Negative for worsening distension or other blood Genitourinary: Negative for retention or other urinary  change Musculoskeletal: Negative for other MSK pain or swelling Skin: Negative for color change or other new lesions Neurological: Negative for worsening tremors and other numbness  Psychiatric/Behavioral: Negative for worsening agitation or other fatigue All other system neg per pt    Objective:   Physical Exam BP 124/62 (BP Location: Left Arm, Patient Position: Sitting, Cuff Size: Normal)   Pulse 82   Temp 98.4 F (36.9 C) (Oral)   Ht 5\' 4"  (1.626 m)   Wt 175 lb  12 oz (79.7 kg)   SpO2 98%   BMI 30.17 kg/m  VS noted,  Constitutional: Pt appears in NAD HENT: Head: NCAT.  Right Ear: External ear normal.  Left Ear: External ear normal.  Eyes: . Pupils are equal, round, and reactive to light. Conjunctivae and EOM are normal Nose: without d/c or deformity Neck: Neck supple. Gross normal ROM Cardiovascular: Normal rate and regular rhythm.   Pulmonary/Chest: Effort normal and breath sounds without rales or wheezing.  Abd:  Soft, NT, ND, + BS, no organomegaly Spine: tender mild at the lowest lumbar in midline as well as mild left paravertebral tender Neurological: Pt is alert. At baseline orientation, motor grossly intact Skin: Skin is warm. No rashes, other new lesions, no LE edema Psychiatric: Pt behavior is normal without agitation  No other exam findings  CT LUMBAR SPINE WITHOUT CONTRAST TECHNIQUE: Multidetector CT imaging of the lumbar spine was performed without intravenous contrast administration. Multiplanar CT image reconstructions were also generated.  COMPARISON:  None.  FINDINGS: Segmentation: Normal segmentation. Lowest well-formed disc is labeled the L5-S1 level.  Alignment: Vertebral bodies normally aligned with preservation of the normal lumbar lordosis. No listhesis.  Vertebrae: Vertebral body heights are maintained. No acute fracture. Visualized sacrum intact. SI joints approximated. No focal osseous lesions. Subtle linear lucency through the left transverse process of L3 favored to reflect a normal nutrient foramen.  Paraspinal and other soft tissues: Paraspinous soft tissues within normal limits. Moderate aorto bi-iliac atherosclerotic disease. Possible mild left hydroureteronephrosis, poorly evaluated on this exam. Possible punctate 2 mm left renal calculus (series 4, image 39). Aorto bi-iliac atherosclerotic disease.  Disc levels:  L1-2:  Unremarkable.  L2-3: Diffuse degenerative disc bulge. Mild facet  hypertrophy. No significant stenosis.  L3-4: Mild disc bulge. Mild bilateral facet arthrosis. No significant canal or foraminal stenosis.  L4-5: Diffuse degenerative disc bulge, slightly eccentric to the right. Moderate bilateral facet arthrosis, worse on the right. No significant canal narrowing. Foramina remain patent.  L5-S1: Diffuse degenerative disc bulge with disc desiccation. Bulging disc closely approximates the descending S1 nerve roots without frank neural impingement. Moderate bilateral facet arthrosis, worse on the left. Probable mild bilateral L5 foraminal narrowing.  IMPRESSION: 1. No acute traumatic injury within the lumbar spine. 2. Degenerative disc bulge at L5-S1, closely approximating and potentially irritating either of the descending S1 nerve roots. 3. Multilevel facet arthropathy as above, greatest at L4-5 and L5-S1. 4. Question mild left hydroureteronephrosis with possible 2 mm nonobstructive left renal calculus. Correlation with urinalysis recommended. Additionally, this could be further assessed with dedicated cross-sectional imaging as indicated.   Electronically Signed   By: Jeannine Boga M.D.   On: 09/09/2016 00:20   DG HIP (WITH OR WITHOUT PELVIS) 3-4V BILAT  COMPARISON:  None.  FINDINGS: The cortical margins of the bony pelvis and both hips are intact. No fracture. Pubic symphysis and sacroiliac joints are congruent. Both femoral heads are well-seated in the respective acetabula.  IMPRESSION: No evidence of pelvis or  hip fracture.   Electronically Signed   By: Jeb Levering M.D.   On: 09/09/2016 00:33     Assessment & Plan:

## 2017-11-18 DIAGNOSIS — G8929 Other chronic pain: Secondary | ICD-10-CM | POA: Insufficient documentation

## 2017-11-18 DIAGNOSIS — M545 Low back pain, unspecified: Secondary | ICD-10-CM | POA: Insufficient documentation

## 2017-11-18 NOTE — Assessment & Plan Note (Signed)
stable overall by history and exam, recent data reviewed with pt, and pt to continue medical treatment as before,  to f/u any worsening symptoms or concerns  

## 2017-11-18 NOTE — Assessment & Plan Note (Signed)
Suspect symptoms related to lumbar facet syndrome, for nsaid prn, muscle relaxer prn, and refer orthopedic, may need MRI and/or ESI

## 2017-11-18 NOTE — Assessment & Plan Note (Signed)
stable overall by history and exam, recent data reviewed with pt, and pt to continue medical treatment as before,  to f/u any worsening symptoms or concerns Lab Results  Component Value Date   HGBA1C 6.0 09/20/2016

## 2017-12-07 ENCOUNTER — Telehealth: Payer: Self-pay | Admitting: Internal Medicine

## 2017-12-07 NOTE — Telephone Encounter (Unsigned)
Copied from [place] [phone]. Topic: Quick Communication - Appointment Cancellation >> Dec 07, 2017 10:21 AM Neva Seat wrote: Pt cancelled her AWV for Friday because she had another appt too close to the time of the AWV. Please call pt back on her cell to reschedule the appt.

## 2017-12-08 ENCOUNTER — Ambulatory Visit: Payer: Medicare Other

## 2017-12-08 ENCOUNTER — Telehealth: Payer: Self-pay | Admitting: Internal Medicine

## 2017-12-08 NOTE — Telephone Encounter (Signed)
This has been sent

## 2017-12-08 NOTE — Telephone Encounter (Signed)
Copied from [place] [id number]. Topic: Quick Communication - See Telephone Encounter >> Dec 08, 2017  9:00 AM Blase Mess A wrote: CRM for notification. See Telephone encounter for: 12/08/17. Patient need insurance card faxed to Dr. Vangie Bicker Fax # [phone].

## 2017-12-12 ENCOUNTER — Ambulatory Visit: Payer: Medicare Other

## 2017-12-27 NOTE — Progress Notes (Addendum)
Subjective:   Kelli Jones is a 67 y.o. female who presents for Medicare Annual (Subsequent) preventive examination.  Review of Systems:  No ROS.  Medicare Wellness Visit. Additional risk factors are reflected in the social history.  Cardiac Risk Factors include: advanced age (>54men, >56 women);dyslipidemia;hypertension Sleep patterns: feels rested on waking, gets up 1-2 times nightly to void and sleeps 6-7 hours nightly.    Home Safety/Smoke Alarms: Feels safe in home. Smoke alarms in place.  Living environment; residence and Firearm Safety: 1-story house/ trailer, no firearms. Lives with husband, no needs for DME, good support system Seat Belt Safety/Bike Helmet: Wears seat belt.      Objective:     Vitals: BP (!) 142/90   Pulse 63   Resp 17   Ht 5\' 4"  (1.626 m)   Wt 177 lb (80.3 kg)   SpO2 100%   BMI 30.38 kg/m   Body mass index is 30.38 kg/m.  Advanced Directives 12/28/2017 09/08/2016 08/20/2015 08/12/2015  Does Patient Have a Medical Advance Directive? No No No No  Would patient like information on creating a medical advance directive? Yes (ED - Information included in AVS) - No - patient declined information -    Tobacco Social History   Tobacco Use  Smoking Status Never Smoker  Smokeless Tobacco Never Used     Counseling given: Not Answered  Past Medical History:  Diagnosis Date  . Arthritis    in hands  . Dyslipidemia   . Hypertension   . PID (pelvic inflammatory disease)    Tubal obstruction  . STD (sexually transmitted disease)    HSV   Past Surgical History:  Procedure Laterality Date  . BREAST BIOPSY  1990   neg/  . BREAST LUMPECTOMY WITH RADIOACTIVE SEED LOCALIZATION Left 08/20/2015   Procedure: BREAST LUMPECTOMY WITH RADIOACTIVE SEED LOCALIZATION;  Surgeon: Donnie Mesa, MD;  Location: [facility];  Service: General;  Laterality: Left;  . DILATION AND CURETTAGE OF UTERUS  1979  . PELVIC LAPAROSCOPY     DL tubal lavage  .  TONSILLECTOMY AND ADENOIDECTOMY     Family History  Problem Relation Age of Onset  . Kidney cancer Father   . Diabetes Mother   . Hypertension Mother   . Heart disease Brother    Social History   Socioeconomic History  . Marital status: Married    Spouse name: Not on file  . Number of children: 1  . Years of education: Not on file  . Highest education level: Not on file  Occupational History  . Not on file  Social Needs  . Financial resource strain: Not hard at all  . Food insecurity:    Worry: Never true    Inability: Never true  . Transportation needs:    Medical: No    Non-medical: No  Tobacco Use  . Smoking status: Never Smoker  . Smokeless tobacco: Never Used  Substance and Sexual Activity  . Alcohol use: Yes    Alcohol/week: 0.0 standard drinks    Comment: Rare  . Drug use: No  . Sexual activity: Yes    Birth control/protection: Post-menopausal    Comment: 1st intercourse 88 yo-5 partners  Lifestyle  . Physical activity:    Days per week: 3 days    Minutes per session: 60 min  . Stress: Only a little  Relationships  . Social connections:    Talks on phone: More than three times a week    Gets together:  More than three times a week    Attends religious service: More than 4 times per year    Active member of club or organization: Yes    Attends meetings of clubs or organizations: More than 4 times per year    Relationship status: Married  Other Topics Concern  . Not on file  Social History Narrative  . Not on file    Outpatient Encounter Medications as of 12/28/2017  Medication Sig  . acyclovir ointment (ZOVIRAX) 5 % Apply 1 application topically every 3 (three) hours.  Marland Kitchen amLODipine (NORVASC) 10 MG tablet Take 1 tablet (10 mg total) by mouth daily.  Marland Kitchen aspirin 81 MG EC tablet Take 1 tablet (81 mg total) by mouth daily. Swallow whole.  . Biotin w/ Vitamins C & E (HAIR/SKIN/NAILS PO) Take by mouth as needed.  . Calcium Carb-Cholecalciferol (CALCIUM + D3  PO) Take by mouth daily.  . cyclobenzaprine (FLEXERIL) 5 MG tablet Take 1 tablet (5 mg total) by mouth 3 (three) times daily as needed for muscle spasms.  Marland Kitchen losartan-hydrochlorothiazide (HYZAAR) 100-25 MG tablet Take 1 tablet by mouth daily.  Marland Kitchen lovastatin (MEVACOR) 40 MG tablet Take 1 tablet (40 mg total) by mouth at bedtime. Must keep appt for future refills  . meloxicam (MOBIC) 15 MG tablet Take 1 tablet (15 mg total) by mouth daily.  . Multiple Vitamin (MULTIVITAMIN) tablet Take 1 tablet by mouth. Alive Women's MVI-Take one daily  . potassium chloride (K-DUR) 10 MEQ tablet Take 1 tablet (10 mEq total) by mouth daily.  . valACYclovir (VALTREX) 500 MG tablet Take 1 tablet (500 mg total) by mouth 2 (two) times daily. With outbreak   No facility-administered encounter medications on file as of 12/28/2017.     Activities of Daily Living In your present state of health, do you have any difficulty performing the following activities: 12/28/2017  Hearing? N  Vision? N  Difficulty concentrating or making decisions? N  Walking or climbing stairs? N  Dressing or bathing? N  Doing errands, shopping? N  Preparing Food and eating ? N  Using the Toilet? N  In the past six months, have you accidently leaked urine? N  Do you have problems with loss of bowel control? N  Managing your Medications? N  Managing your Finances? N  Housekeeping or managing your Housekeeping? N  Some recent data might be hidden    Patient Care Team: Biagio Borg, MD as PCP - General    Assessment:   This is a routine wellness examination for Kelli Jones. Physical assessment deferred to PCP.   Exercise Activities and Dietary recommendations Current Exercise Habits: Home exercise routine, Type of exercise: walking;calisthenics(aerobics), Time (Minutes): 60, Frequency (Times/Week): 3, Weekly Exercise (Minutes/Week): 180, Intensity: Mild, Exercise limited by: orthopedic condition(s)  Diet (meal preparation, eat out, water  intake, caffeinated beverages, dairy products, fruits and vegetables): in general, a "healthy" diet  , well balanced   Reviewed heart healthy and diabetic diet. Encouraged patient to increase daily water and healthy fluid intake. Relevant patient education assigned to patient using Emmi.  Goals    . Patient Stated     Continue to worship God and take time for me to go out and enjoy life. Spend time with my grandson and stay socially engaged.       Fall Risk Fall Risk  12/28/2017 09/20/2016 06/08/2015 12/24/2014 11/07/2013  Falls in the past year? No Yes No No No  Number falls in past yr: - 1 - - -  Depression Screen PHQ 2/9 Scores 12/28/2017 09/20/2016 06/08/2015 12/24/2014  PHQ - 2 Score 0 3 0 0  PHQ- 9 Score - 5 - -     Cognitive Function       Ad8 score reviewed for issues:  Issues making decisions: no  Less interest in hobbies / activities: no  Repeats questions, stories (family complaining): no  Trouble using ordinary gadgets (microwave, computer, phone):no  Forgets the month or year: no  Mismanaging finances: no  Remembering appts: no  Daily problems with thinking and/or memory: no Ad8 score is= 0  Immunization History  Administered Date(s) Administered  . H1N1 02/04/2008  . Influenza Split 01/31/2012, 12/12/2012  . Influenza Whole 02/23/2007  . Influenza, High Dose Seasonal PF 12/28/2017  . Influenza,inj,Quad PF,6+ Mos 12/24/2014  . Pneumococcal Conjugate-13 11/07/2013  . Pneumococcal Polysaccharide-23 12/24/2014  . Tdap 07/06/2012   Screening Tests Health Maintenance  Topic Date Due  . MAMMOGRAM  10/01/2018  . TETANUS/TDAP  07/07/2022  . COLONOSCOPY  09/01/2025  . INFLUENZA VACCINE  Completed  . DEXA SCAN  Completed  . Hepatitis C Screening  Completed  . PNA vac Low Risk Adult  Completed      Plan:     Continue doing brain stimulating activities (puzzles, reading, adult coloring books, staying active) to keep memory sharp.   Continue to eat  heart healthy diet (full of fruits, vegetables, whole grains, lean protein, water--limit salt, fat, and sugar intake) and increase physical activity as tolerated.  I have personally reviewed and noted the following in the patient's chart:   . Medical and social history . Use of alcohol, tobacco or illicit drugs  . Current medications and supplements . Functional ability and status . Nutritional status . Physical activity . Advanced directives . List of other physicians . Vitals . Screenings to include cognitive, depression, and falls . Referrals and appointments  In addition, I have reviewed and discussed with patient certain preventive protocols, quality metrics, and best practice recommendations. A written personalized care plan for preventive services as well as general preventive health recommendations were provided to patient.     Michiel Cowboy, RN  12/28/2017    Medical screening examination/treatment/procedure(s) were performed by non-physician practitioner and as supervising physician I was immediately available for consultation/collaboration. I agree with above. Cathlean Cower, MD

## 2017-12-28 ENCOUNTER — Ambulatory Visit (INDEPENDENT_AMBULATORY_CARE_PROVIDER_SITE_OTHER): Payer: Medicare Other | Admitting: *Deleted

## 2017-12-28 VITALS — BP 142/90 | HR 63 | Resp 17 | Ht 64.0 in | Wt 177.0 lb

## 2017-12-28 DIAGNOSIS — Z0000 Encounter for general adult medical examination without abnormal findings: Secondary | ICD-10-CM | POA: Diagnosis not present

## 2017-12-28 DIAGNOSIS — Z23 Encounter for immunization: Secondary | ICD-10-CM

## 2017-12-28 NOTE — Patient Instructions (Addendum)
Continue doing brain stimulating activities (puzzles, reading, adult coloring books, staying active) to keep memory sharp.   Continue to eat heart healthy diet (full of fruits, vegetables, whole grains, lean protein, water--limit salt, fat, and sugar intake) and increase physical activity as tolerated.   Kelli Jones , Thank you for taking time to come for your Medicare Wellness Visit. I appreciate your ongoing commitment to your health goals. Please review the following plan we discussed and let me know if I can assist you in the future.   These are the goals we discussed: Goals    . Patient Stated     Continue to worship God and time for me to go out and enjoy life. Spend time with my grandson and stay socially engaged.       This is a list of the screening recommended for you and due dates:  Health Maintenance  Topic Date Due  . Flu Shot  11/02/2017  . Mammogram  10/01/2018  . Tetanus Vaccine  07/07/2022  . Colon Cancer Screening  09/01/2025  . DEXA scan (bone density measurement)  Completed  .  Hepatitis C: One time screening is recommended by Center for Disease Control  (CDC) for  adults born from [birth year] through 1965.   Completed  . Pneumonia vaccines  Completed     Influenza Virus Vaccine injection What is this medicine? INFLUENZA VIRUS VACCINE (in floo EN zuh VAHY ruhs vak SEEN) helps to reduce the risk of getting influenza also known as the flu. The vaccine only helps protect you against some strains of the flu. This medicine may be used for other purposes; ask your health care provider or pharmacist if you have questions. COMMON BRAND NAME(S): Afluria, Agriflu, Alfuria, FLUAD, Fluarix, Fluarix Quadrivalent, Flublok, Flublok Quadrivalent, FLUCELVAX, Flulaval, Fluvirin, Fluzone, Fluzone High-Dose, Fluzone Intradermal What should I tell my health care provider before I take this medicine? They need to know if you have any of these conditions: -bleeding disorder like  hemophilia -fever or infection -Guillain-Barre syndrome or other neurological problems -immune system problems -infection with the human immunodeficiency virus (HIV) or AIDS -low blood platelet counts -multiple sclerosis -an unusual or allergic reaction to influenza virus vaccine, latex, other medicines, foods, dyes, or preservatives. Different brands of vaccines contain different allergens. Some may contain latex or eggs. Talk to your doctor about your allergies to make sure that you get the right vaccine. -pregnant or trying to get pregnant -breast-feeding How should I use this medicine? This vaccine is for injection into a muscle or under the skin. It is given by a health care professional. A copy of Vaccine Information Statements will be given before each vaccination. Read this sheet carefully each time. The sheet may change frequently. Talk to your healthcare provider to see which vaccines are right for you. Some vaccines should not be used in all age groups. Overdosage: If you think you have taken too much of this medicine contact a poison control center or emergency room at once. NOTE: This medicine is only for you. Do not share this medicine with others. What if I miss a dose? This does not apply. What may interact with this medicine? -chemotherapy or radiation therapy -medicines that lower your immune system like etanercept, anakinra, infliximab, and adalimumab -medicines that treat or prevent blood clots like warfarin -phenytoin -steroid medicines like prednisone or cortisone -theophylline -vaccines This list may not describe all possible interactions. Give your health care provider a list of all the medicines, herbs, non-prescription drugs,  or dietary supplements you use. Also tell them if you smoke, drink alcohol, or use illegal drugs. Some items may interact with your medicine. What should I watch for while using this medicine? Report any side effects that do not go away  within 3 days to your doctor or health care professional. Call your health care provider if any unusual symptoms occur within 6 weeks of receiving this vaccine. You may still catch the flu, but the illness is not usually as bad. You cannot get the flu from the vaccine. The vaccine will not protect against colds or other illnesses that may cause fever. The vaccine is needed every year. What side effects may I notice from receiving this medicine? Side effects that you should report to your doctor or health care professional as soon as possible: -allergic reactions like skin rash, itching or hives, swelling of the face, lips, or tongue Side effects that usually do not require medical attention (report to your doctor or health care professional if they continue or are bothersome): -fever -headache -muscle aches and pains -pain, tenderness, redness, or swelling at the injection site -tiredness This list may not describe all possible side effects. Call your doctor for medical advice about side effects. You may report side effects to FDA at 1-800-FDA-1088. Where should I keep my medicine? The vaccine will be given by a health care professional in a clinic, pharmacy, doctor's office, or other health care setting. You will not be given vaccine doses to store at home. NOTE: This sheet is a summary. It may not cover all possible information. If you have questions about this medicine, talk to your doctor, pharmacist, or health care provider.  2018 Elsevier/Gold Standard (2014-10-10 10:07:28)  Health Maintenance, Female Adopting a healthy lifestyle and getting preventive care can go a long way to promote health and wellness. Talk with your health care provider about what schedule of regular examinations is right for you. This is a good chance for you to check in with your provider about disease prevention and staying healthy. In between checkups, there are plenty of things you can do on your own. Experts have  done a lot of research about which lifestyle changes and preventive measures are most likely to keep you healthy. Ask your health care provider for more information. Weight and diet Eat a healthy diet  Be sure to include plenty of vegetables, fruits, low-fat dairy products, and lean protein.  Do not eat a lot of foods high in solid fats, added sugars, or salt.  Get regular exercise. This is one of the most important things you can do for your health. ? Most adults should exercise for at least 150 minutes each week. The exercise should increase your heart rate and make you sweat (moderate-intensity exercise). ? Most adults should also do strengthening exercises at least twice a week. This is in addition to the moderate-intensity exercise.  Maintain a healthy weight  Body mass index (BMI) is a measurement that can be used to identify possible weight problems. It estimates body fat based on height and weight. Your health care provider can help determine your BMI and help you achieve or maintain a healthy weight.  For females 31 years of age and older: ? A BMI below 18.5 is considered underweight. ? A BMI of 18.5 to 24.9 is normal. ? A BMI of 25 to 29.9 is considered overweight. ? A BMI of 30 and above is considered obese.  Watch levels of cholesterol and blood lipids  You should start having your blood tested for lipids and cholesterol at 69 years of age, then have this test every 5 years.  You may need to have your cholesterol levels checked more often if: ? Your lipid or cholesterol levels are high. ? You are older than 69 years of age. ? You are at high risk for heart disease.  Cancer screening Lung Cancer  Lung cancer screening is recommended for adults 4-70 years old who are at high risk for lung cancer because of a history of smoking.  A yearly low-dose CT scan of the lungs is recommended for people who: ? Currently smoke. ? Have quit within the past 15 years. ? Have at  least a 30-pack-year history of smoking. A pack year is smoking an average of one pack of cigarettes a day for 1 year.  Yearly screening should continue until it has been 15 years since you quit.  Yearly screening should stop if you develop a health problem that would prevent you from having lung cancer treatment.  Breast Cancer  Practice breast self-awareness. This means understanding how your breasts normally appear and feel.  It also means doing regular breast self-exams. Let your health care provider know about any changes, no matter how small.  If you are in your 20s or 30s, you should have a clinical breast exam (CBE) by a health care provider every 1-3 years as part of a regular health exam.  If you are 51 or older, have a CBE every year. Also consider having a breast X-ray (mammogram) every year.  If you have a family history of breast cancer, talk to your health care provider about genetic screening.  If you are at high risk for breast cancer, talk to your health care provider about having an MRI and a mammogram every year.  Breast cancer gene (BRCA) assessment is recommended for women who have family members with BRCA-related cancers. BRCA-related cancers include: ? Breast. ? Ovarian. ? Tubal. ? Peritoneal cancers.  Results of the assessment will determine the need for genetic counseling and BRCA1 and BRCA2 testing.  Cervical Cancer Your health care provider may recommend that you be screened regularly for cancer of the pelvic organs (ovaries, uterus, and vagina). This screening involves a pelvic examination, including checking for microscopic changes to the surface of your cervix (Pap test). You may be encouraged to have this screening done every 3 years, beginning at age 51.  For women ages 5-65, health care providers may recommend pelvic exams and Pap testing every 3 years, or they may recommend the Pap and pelvic exam, combined with testing for human papilloma virus  (HPV), every 5 years. Some types of HPV increase your risk of cervical cancer. Testing for HPV may also be done on women of any age with unclear Pap test results.  Other health care providers may not recommend any screening for nonpregnant women who are considered low risk for pelvic cancer and who do not have symptoms. Ask your health care provider if a screening pelvic exam is right for you.  If you have had past treatment for cervical cancer or a condition that could lead to cancer, you need Pap tests and screening for cancer for at least 20 years after your treatment. If Pap tests have been discontinued, your risk factors (such as having a new sexual partner) need to be reassessed to determine if screening should resume. Some women have medical problems that increase the chance of getting cervical cancer. In these  cases, your health care provider may recommend more frequent screening and Pap tests.  Colorectal Cancer  This type of cancer can be detected and often prevented.  Routine colorectal cancer screening usually begins at 69 years of age and continues through 69 years of age.  Your health care provider may recommend screening at an earlier age if you have risk factors for colon cancer.  Your health care provider may also recommend using home test kits to check for hidden blood in the stool.  A small camera at the end of a tube can be used to examine your colon directly (sigmoidoscopy or colonoscopy). This is done to check for the earliest forms of colorectal cancer.  Routine screening usually begins at age 52.  Direct examination of the colon should be repeated every 5-10 years through 69 years of age. However, you may need to be screened more often if early forms of precancerous polyps or small growths are found.  Skin Cancer  Check your skin from head to toe regularly.  Tell your health care provider about any new moles or changes in moles, especially if there is a change in a  mole's shape or color.  Also tell your health care provider if you have a mole that is larger than the size of a pencil eraser.  Always use sunscreen. Apply sunscreen liberally and repeatedly throughout the day.  Protect yourself by wearing long sleeves, pants, a wide-brimmed hat, and sunglasses whenever you are outside.  Heart disease, diabetes, and high blood pressure  High blood pressure causes heart disease and increases the risk of stroke. High blood pressure is more likely to develop in: ? People who have blood pressure in the high end of the normal range (130-139/85-89 mm Hg). ? People who are overweight or obese. ? People who are African American.  If you are 20-105 years of age, have your blood pressure checked every 3-5 years. If you are 2 years of age or older, have your blood pressure checked every year. You should have your blood pressure measured twice-once when you are at a hospital or clinic, and once when you are not at a hospital or clinic. Record the average of the two measurements. To check your blood pressure when you are not at a hospital or clinic, you can use: ? An automated blood pressure machine at a pharmacy. ? A home blood pressure monitor.  If you are between 53 years and 10 years old, ask your health care provider if you should take aspirin to prevent strokes.  Have regular diabetes screenings. This involves taking a blood sample to check your fasting blood sugar level. ? If you are at a normal weight and have a low risk for diabetes, have this test once every three years after 69 years of age. ? If you are overweight and have a high risk for diabetes, consider being tested at a younger age or more often. Preventing infection Hepatitis B  If you have a higher risk for hepatitis B, you should be screened for this virus. You are considered at high risk for hepatitis B if: ? You were born in a country where hepatitis B is common. Ask your health care provider  which countries are considered high risk. ? Your parents were born in a high-risk country, and you have not been immunized against hepatitis B (hepatitis B vaccine). ? You have HIV or AIDS. ? You use needles to inject street drugs. ? You live with someone who has  hepatitis B. ? You have had sex with someone who has hepatitis B. ? You get hemodialysis treatment. ? You take certain medicines for conditions, including cancer, organ transplantation, and autoimmune conditions.  Hepatitis C  Blood testing is recommended for: ? Everyone born from [birth year] through 1967. ? Anyone with known risk factors for hepatitis C.  Sexually transmitted infections (STIs)  You should be screened for sexually transmitted infections (STIs) including gonorrhea and chlamydia if: ? You are sexually active and are younger than 69 years of age. ? You are older than 69 years of age and your health care provider tells you that you are at risk for this type of infection. ? Your sexual activity has changed since you were last screened and you are at an increased risk for chlamydia or gonorrhea. Ask your health care provider if you are at risk.  If you do not have HIV, but are at risk, it may be recommended that you take a prescription medicine daily to prevent HIV infection. This is called pre-exposure prophylaxis (PrEP). You are considered at risk if: ? You are sexually active and do not regularly use condoms or know the HIV status of your partner(s). ? You take drugs by injection. ? You are sexually active with a partner who has HIV.  Talk with your health care provider about whether you are at high risk of being infected with HIV. If you choose to begin PrEP, you should first be tested for HIV. You should then be tested every 3 months for as long as you are taking PrEP. Pregnancy  If you are premenopausal and you may become pregnant, ask your health care provider about preconception counseling.  If you may become  pregnant, take 400 to 800 micrograms (mcg) of folic acid every day.  If you want to prevent pregnancy, talk to your health care provider about birth control (contraception). Osteoporosis and menopause  Osteoporosis is a disease in which the bones lose minerals and strength with aging. This can result in serious bone fractures. Your risk for osteoporosis can be identified using a bone density scan.  If you are 38 years of age or older, or if you are at risk for osteoporosis and fractures, ask your health care provider if you should be screened.  Ask your health care provider whether you should take a calcium or vitamin D supplement to lower your risk for osteoporosis.  Menopause may have certain physical symptoms and risks.  Hormone replacement therapy may reduce some of these symptoms and risks. Talk to your health care provider about whether hormone replacement therapy is right for you. Follow these instructions at home:  Schedule regular health, dental, and eye exams.  Stay current with your immunizations.  Do not use any tobacco products including cigarettes, chewing tobacco, or electronic cigarettes.  If you are pregnant, do not drink alcohol.  If you are breastfeeding, limit how much and how often you drink alcohol.  Limit alcohol intake to no more than 1 drink per day for nonpregnant women. One drink equals 12 ounces of beer, 5 ounces of Brenlynn Fake, or 1 ounces of hard liquor.  Do not use street drugs.  Do not share needles.  Ask your health care provider for help if you need support or information about quitting drugs.  Tell your health care provider if you often feel depressed.  Tell your health care provider if you have ever been abused or do not feel safe at home. This information is not intended  to replace advice given to you by your health care provider. Make sure you discuss any questions you have with your health care provider. Document Released: 10/04/2010 Document  Revised: 08/27/2015 Document Reviewed: 12/23/2014 Elsevier Interactive Patient Education  [employer].

## 2018-01-22 ENCOUNTER — Other Ambulatory Visit: Payer: Self-pay | Admitting: Internal Medicine

## 2018-01-26 ENCOUNTER — Other Ambulatory Visit: Payer: Self-pay | Admitting: Internal Medicine

## 2018-01-30 ENCOUNTER — Telehealth: Payer: Self-pay | Admitting: Internal Medicine

## 2018-01-30 NOTE — Telephone Encounter (Signed)
Copied from [place] [id number]. Topic: General - Other >> Jan 30, 2018  4:32 PM Keene Breath wrote: Reason for CRM: Patient called to request that her refills for lovastatin (MEVACOR) 40 MG tablet be changed to a 90 supply like her other medications.  Please advise and call back if you have any questions [phone]# [phone].

## 2018-01-31 NOTE — Telephone Encounter (Signed)
Noted  

## 2018-03-27 ENCOUNTER — Other Ambulatory Visit: Payer: Self-pay | Admitting: Internal Medicine

## 2018-05-11 ENCOUNTER — Ambulatory Visit: Payer: Medicare Other | Admitting: Gynecology

## 2018-05-11 ENCOUNTER — Encounter: Payer: Self-pay | Admitting: Gynecology

## 2018-05-11 VITALS — BP 142/80 | Ht 64.0 in | Wt 176.0 lb

## 2018-05-11 DIAGNOSIS — Z01419 Encounter for gynecological examination (general) (routine) without abnormal findings: Secondary | ICD-10-CM

## 2018-05-11 DIAGNOSIS — N952 Postmenopausal atrophic vaginitis: Secondary | ICD-10-CM

## 2018-05-11 NOTE — Progress Notes (Signed)
    Kelli Jones 03/07/1951 [id number]        68 y.o.  G1P1001 for annual gynecologic exam.  Without gynecologic complaints  Past medical history,surgical history, problem list, medications, allergies, family history and social history were all reviewed and documented as reviewed in the EPIC chart.  ROS:  Performed with pertinent positives and negatives included in the history, assessment and plan.   Additional significant findings : None   Exam: [name] assistant Vitals:   05/11/18 0939  BP: (!) 142/80  Weight: 176 lb (79.8 kg)  Height: 5\' 4"  (1.626 m)   Body mass index is 30.21 kg/m.  General appearance:  Normal affect, orientation and appearance. Skin: Grossly normal HEENT: Without gross lesions.  No cervical or supraclavicular adenopathy. Thyroid normal.  Lungs:  Clear without wheezing, rales or rhonchi Cardiac: RR, without RMG Abdominal:  Soft, nontender, without masses, guarding, rebound, organomegaly or hernia Breasts:  Examined lying and sitting without masses, retractions, discharge or axillary adenopathy. Pelvic:  Ext, BUS, Vagina: With atrophic changes  Cervix: With atrophic changes  Uterus: Anteverted, normal size, shape and contour, midline and mobile nontender   Adnexa: Without masses or tenderness    Anus and perineum: Normal   Rectovaginal: Normal sphincter tone without palpated masses or tenderness.    Assessment/Plan:  68 y.o. G73P1001 female for annual gynecologic exam.   1. Postmenopausal.  No significant menopausal symptoms or any vaginal bleeding. 2. Pap smear 2016.  No Pap smear done today.  No history of significant abnormal Pap smears.  We discussed current screening guidelines and she is comfortable stop screening based on age. 3. DEXA 2015 normal.  Recommend repeat DEXA now at 5-year interval and she will schedule in follow-up for this. 4. Colonoscopy 2017.  Repeat at their recommended interval. 5. Mammography 09/2016.  Need to schedule now.   Most common cancer in women reviewed.  Breast exam normal today. 6. Health maintenance.  Blood pressure 142/80 discussed.  Recommended she recheck it in a non-exam situation.  Follow-up with primary if continues elevated.  No routine lab work done as patient does this elsewhere.  Follow-up for bone density otherwise follow-up in 1 year for annual exam.   Anastasio Auerbach MD, 10:04 AM 05/11/2018

## 2018-05-11 NOTE — Patient Instructions (Addendum)
Schedule your mammogram.  Follow-up for the bone density as scheduled.  Recheck your blood pressure in a non-exam situation.  It was 142/80 today which is mildly elevated.

## 2018-06-03 ENCOUNTER — Other Ambulatory Visit: Payer: Self-pay | Admitting: Internal Medicine

## 2018-06-27 ENCOUNTER — Telehealth: Payer: Self-pay | Admitting: Internal Medicine

## 2018-06-27 MED ORDER — LOSARTAN POTASSIUM-HCTZ 100-25 MG PO TABS: 1.0000 | ORAL_TABLET | Freq: Every day | ORAL | 1 refills | Status: DC

## 2018-06-27 NOTE — Telephone Encounter (Signed)
Pt called and stated she was unsure if she was suppose to continue her losartan-hctz. She states when she had her most recent visit this question was not answered. If someone could reach out to patient and clarify with pt her medications she is suppose to be taking.

## 2018-06-27 NOTE — Telephone Encounter (Signed)
Pt has been informed and a refill was sent in for her. She expressed that she only had a few pills left.

## 2018-06-27 NOTE — Telephone Encounter (Signed)
Pt was last seen by me aug 2019  The AVS at that visit clearly shows to continue the losartan HCT, thanks

## 2018-06-27 NOTE — Addendum Note (Signed)
Addended by: Juliet Rude on: 06/27/2018 02:11 PM   Modules accepted: Orders

## 2018-07-31 ENCOUNTER — Telehealth: Payer: Self-pay | Admitting: Internal Medicine

## 2018-07-31 NOTE — Telephone Encounter (Signed)
Copied from [place] [id number]. Topic: Quick Communication - Rx Refill/Question >> Jul 31, 2018  4:18 PM Ahmed Prima L wrote: Medication: valACYclovir (VALTREX) 500 MG tablet & acyclovir ointment (ZOVIRAX) 5 %   Has the patient contacted their pharmacy? No refills (Agent: If no, request that the patient contact the pharmacy for the refill.) (Agent: If yes, when and what did the pharmacy advise?)  Preferred Pharmacy (with phone number or street name): [id number] [address] [place], [place] [address] [place] [address] Phone: [phone] Fax: [phone]    Agent: Please be advised that RX refills may take up to 3 business days. We ask that you follow-up with your pharmacy.

## 2018-08-01 MED ORDER — VALACYCLOVIR HCL 500 MG PO TABS: 500.0000 mg | ORAL_TABLET | Freq: Two times a day (BID) | ORAL | 12 refills | Status: DC

## 2018-08-01 MED ORDER — ACYCLOVIR 5 % EX OINT: 1.0000 "application " | TOPICAL_OINTMENT | CUTANEOUS | 5 refills | Status: AC

## 2018-09-24 ENCOUNTER — Other Ambulatory Visit: Payer: Self-pay

## 2018-09-24 MED ORDER — LOVASTATIN 40 MG PO TABS: 40.0000 mg | ORAL_TABLET | Freq: Every day | ORAL | 1 refills | Status: DC

## 2019-01-02 ENCOUNTER — Ambulatory Visit: Payer: Medicare Other

## 2019-01-08 ENCOUNTER — Encounter: Payer: Self-pay | Admitting: Gynecology

## 2019-01-08 ENCOUNTER — Ambulatory Visit (INDEPENDENT_AMBULATORY_CARE_PROVIDER_SITE_OTHER): Payer: Medicare Other

## 2019-01-08 ENCOUNTER — Other Ambulatory Visit: Payer: Self-pay

## 2019-01-08 ENCOUNTER — Ambulatory Visit (INDEPENDENT_AMBULATORY_CARE_PROVIDER_SITE_OTHER): Payer: Medicare Other | Admitting: Gynecology

## 2019-01-08 ENCOUNTER — Other Ambulatory Visit: Payer: Self-pay | Admitting: Gynecology

## 2019-01-08 DIAGNOSIS — Z780 Asymptomatic menopausal state: Secondary | ICD-10-CM | POA: Diagnosis not present

## 2019-01-08 DIAGNOSIS — Z23 Encounter for immunization: Secondary | ICD-10-CM | POA: Diagnosis not present

## 2019-01-08 DIAGNOSIS — Z01419 Encounter for gynecological examination (general) (routine) without abnormal findings: Secondary | ICD-10-CM

## 2019-01-09 ENCOUNTER — Encounter: Payer: Self-pay | Admitting: Gynecology

## 2019-01-11 ENCOUNTER — Other Ambulatory Visit: Payer: Self-pay

## 2019-01-11 MED ORDER — AMLODIPINE BESYLATE 10 MG PO TABS: 10.0000 mg | ORAL_TABLET | Freq: Every day | ORAL | 1 refills | Status: DC

## 2019-02-18 ENCOUNTER — Ambulatory Visit: Payer: Self-pay | Admitting: *Deleted

## 2019-02-18 ENCOUNTER — Emergency Department (HOSPITAL_COMMUNITY)
Admission: EM | Admit: 2019-02-18 | Discharge: 2019-02-18 | Disposition: A | Discharge status: Home / Self Care | Payer: Medicare Other | Attending: Emergency Medicine | Admitting: Emergency Medicine

## 2019-02-18 ENCOUNTER — Encounter (HOSPITAL_COMMUNITY): Payer: Self-pay | Admitting: Emergency Medicine

## 2019-02-18 ENCOUNTER — Emergency Department (HOSPITAL_COMMUNITY): Payer: Medicare Other

## 2019-02-18 ENCOUNTER — Other Ambulatory Visit: Payer: Self-pay

## 2019-02-18 DIAGNOSIS — R0789 Other chest pain: Secondary | ICD-10-CM | POA: Diagnosis not present

## 2019-02-18 DIAGNOSIS — Z7982 Long term (current) use of aspirin: Secondary | ICD-10-CM | POA: Diagnosis not present

## 2019-02-18 DIAGNOSIS — R002 Palpitations: Secondary | ICD-10-CM | POA: Diagnosis present

## 2019-02-18 DIAGNOSIS — Z79899 Other long term (current) drug therapy: Secondary | ICD-10-CM | POA: Diagnosis not present

## 2019-02-18 DIAGNOSIS — I10 Essential (primary) hypertension: Secondary | ICD-10-CM | POA: Insufficient documentation

## 2019-02-18 LAB — BASIC METABOLIC PANEL
Anion gap: 13 (ref 5–15)
BUN: 12 mg/dL (ref 8–23)
CO2: 24 mmol/L (ref 22–32)
Calcium: 9.4 mg/dL (ref 8.9–10.3)
Chloride: 104 mmol/L (ref 98–111)
Creatinine, Ser: 0.99 mg/dL (ref 0.44–1.00)
GFR calc Af Amer: >60 mL/min (ref >60)
GFR calc non Af Amer: 58 mL/min — ABNORMAL LOW (ref >60)
Glucose, Bld: 107 mg/dL — ABNORMAL HIGH (ref 70–99)
Potassium: 3.9 mmol/L (ref 3.5–5.1)
Sodium: 141 mmol/L (ref 135–145)

## 2019-02-18 LAB — CBC
HCT: 41.2 % (ref 36.0–46.0)
Hemoglobin: 13 g/dL (ref 12.0–15.0)
MCH: 28.2 pg (ref 26.0–34.0)
MCHC: 31.6 g/dL (ref 30.0–36.0)
MCV: 89.4 fL (ref 80.0–100.0)
Platelets: 291 10*3/uL (ref 150–400)
RBC: 4.61 MIL/uL (ref 3.87–5.11)
RDW: 14.1 % (ref 11.5–15.5)
WBC: 8.7 10*3/uL (ref 4.0–10.5)
nRBC: 0 % (ref 0.0–0.2)

## 2019-02-18 LAB — TROPONIN I (HIGH SENSITIVITY): Troponin I (High Sensitivity): <2 ng/L (ref <18)

## 2019-02-18 MED ORDER — SODIUM CHLORIDE 0.9% FLUSH: 3.0000 mL | Freq: Once | INTRAVENOUS | Status: DC

## 2019-02-18 NOTE — ED Provider Notes (Signed)
[facility] Provider Note   CSN: [id number] Arrival date & time: 02/18/19  1443     History   Chief Complaint Chief Complaint  Patient presents with  . Palpitations    HPI Kelli Jones is a 68 y.o. female.     Patient is a 68 year old female with history of hypertension, hyperlipidemia.  She presents today for evaluation of chest discomfort and palpitations.  This is been ongoing for the past week and a half.  She describes a heavy sensation to the front of her chest with associated heart fluttering.  She denies any difficulty breathing, diaphoresis, or nausea.  She denies any radiation to her arms or jaw.  She denies any exertional component.  Patient has no prior cardiac history.  She does describe increased stress in her life related to her husband having had eye surgery, not working for her many months, and her having to take care of him.  The history is provided by the patient.  Palpitations Palpitations quality:  Fast Duration:  10 days Timing:  Intermittent Progression:  Worsening Chronicity:  New Relieved by:  Nothing Worsened by:  Nothing Ineffective treatments:  None tried   Past Medical History:  Diagnosis Date  . Arthritis    in hands  . Dyslipidemia   . Hypertension   . PID (pelvic inflammatory disease)    Tubal obstruction  . STD (sexually transmitted disease)    HSV    Patient Active Problem List   Diagnosis Date Noted  . Chronic left-sided low back pain without sciatica 11/18/2017  . Left leg swelling 09/20/2016  . Left leg pain 06/08/2015  . Corn of foot 06/08/2015  . Bradycardia 12/24/2014  . Impaired glucose tolerance 07/06/2012  . STD (sexually transmitted disease)   . PID (pelvic inflammatory disease)   . Dyslipidemia   . Encounter for well adult exam with abnormal findings 10/06/2011  . Essential hypertension 02/23/2007    Past Surgical History:  Procedure Laterality Date  . BREAST BIOPSY   1990   neg/  . BREAST LUMPECTOMY WITH RADIOACTIVE SEED LOCALIZATION Left 08/20/2015   Procedure: BREAST LUMPECTOMY WITH RADIOACTIVE SEED LOCALIZATION;  Surgeon: Donnie Mesa, MD;  Location: [facility];  Service: General;  Laterality: Left;  . DILATION AND CURETTAGE OF UTERUS  1979  . PELVIC LAPAROSCOPY     DL tubal lavage  . TONSILLECTOMY AND ADENOIDECTOMY       OB History    Gravida  1   Para  1   Term  1   Preterm      AB      Living  1     SAB      TAB      Ectopic      Multiple      Live Births               Home Medications    Prior to Admission medications   Medication Sig Start Date End Date Taking? Authorizing Provider  acyclovir ointment (ZOVIRAX) 5 % Apply 1 application topically every 3 (three) hours. 08/01/18   Biagio Borg, MD  amLODipine (NORVASC) 10 MG tablet Take 1 tablet (10 mg total) by mouth daily. 01/11/19   Biagio Borg, MD  aspirin 81 MG EC tablet Take 1 tablet (81 mg total) by mouth daily. Swallow whole. 07/06/12   Biagio Borg, MD  Biotin w/ Vitamins C & E (HAIR/SKIN/NAILS PO) Take by mouth as  needed.    [provider]  Calcium Carb-Cholecalciferol (CALCIUM + D3 PO) Take by mouth daily.    [provider]  cyclobenzaprine (FLEXERIL) 5 MG tablet Take 1 tablet (5 mg total) by mouth 3 (three) times daily as needed for muscle spasms. 11/17/17   Biagio Borg, MD  losartan-hydrochlorothiazide (HYZAAR) 100-25 MG tablet Take 1 tablet by mouth daily. 06/27/18   Biagio Borg, MD  lovastatin (MEVACOR) 40 MG tablet Take 1 tablet (40 mg total) by mouth at bedtime. 09/24/18   Biagio Borg, MD  meloxicam (MOBIC) 15 MG tablet TAKE 1 TABLET(15 MG) BY MOUTH DAILY 06/04/18   Biagio Borg, MD  Multiple Vitamin (MULTIVITAMIN) tablet Take 1 tablet by mouth. Alive Women's MVI-Take one daily    [provider]  potassium chloride (K-DUR) 10 MEQ tablet Take 1 tablet (10 mEq total) by mouth daily. 09/20/16   Biagio Borg, MD   valACYclovir (VALTREX) 500 MG tablet Take 1 tablet (500 mg total) by mouth 2 (two) times daily. With outbreak 08/01/18   Biagio Borg, MD    Family History Family History  Problem Relation Age of Onset  . Kidney cancer Father   . Diabetes Mother   . Hypertension Mother   . Heart disease Brother     Social History Social History   Tobacco Use  . Smoking status: Never Smoker  . Smokeless tobacco: Never Used  Substance Use Topics  . Alcohol use: Not Currently    Alcohol/week: 0.0 standard drinks  . Drug use: No     Allergies   Patient has no known allergies.   Review of Systems Review of Systems  Cardiovascular: Positive for palpitations.  All other systems reviewed and are negative.    Physical Exam Updated Vital Signs BP (!) 190/58   Pulse (!) 53   Temp 98.2 F (36.8 C) (Oral)   Resp 18   SpO2 100%   Physical Exam Vitals signs and nursing note reviewed.  Constitutional:      General: She is not in acute distress.    Appearance: She is well-developed. She is not diaphoretic.  HENT:     Head: Normocephalic and atraumatic.  Neck:     Musculoskeletal: Normal range of motion and neck supple.  Cardiovascular:     Rate and Rhythm: Normal rate and regular rhythm.     Heart sounds: No murmur. No friction rub. No gallop.   Pulmonary:     Effort: Pulmonary effort is normal. No respiratory distress.     Breath sounds: Normal breath sounds. No wheezing.  Abdominal:     General: Bowel sounds are normal. There is no distension.     Palpations: Abdomen is soft.     Tenderness: There is no abdominal tenderness.  Musculoskeletal: Normal range of motion.        General: No swelling or tenderness.     Right lower leg: No edema.     Left lower leg: No edema.     Comments: Bevelyn Buckles' sign is absent bilaterally.  Skin:    General: Skin is warm and dry.  Neurological:     Mental Status: She is alert and oriented to person, place, and time.     Cranial Nerves: No cranial  nerve deficit.     Coordination: Coordination normal.      ED Treatments / Results  Labs (all labs ordered are listed, but only abnormal results are displayed) Labs Reviewed  BASIC METABOLIC PANEL -  Abnormal; Notable for the following components:      Result Value   Glucose, Bld 107 (*)    GFR calc non Af Amer 58 (*)    All other components within normal limits  CBC  TROPONIN I (HIGH SENSITIVITY)  TROPONIN I (HIGH SENSITIVITY)    EKG EKG Interpretation  Date/Time:  Monday February 18 2019 14:53:35 EST Ventricular Rate:  69 PR Interval:  168 QRS Duration: 104 QT Interval:  418 QTC Calculation: 447 R Axis:   32 Text Interpretation: Normal sinus rhythm Septal infarct , age undetermined Abnormal ECG Confirmed by Veryl Speak [phone]) on 02/18/2019 9:36:11 PM   Radiology Dg Chest 2 View  Result Date: 02/18/2019 CLINICAL DATA:  Chest pain and palpitations over the last 10 days. EXAM: CHEST - 2 VIEW COMPARISON:  None. FINDINGS: The heart size and mediastinal contours are within normal limits. Both lungs are clear. The visualized skeletal structures are unremarkable. IMPRESSION: No active cardiopulmonary disease. Electronically Signed   By: Nelson Chimes M.D.   On: 02/18/2019 16:06    Procedures Procedures (including critical care time)  Medications Ordered in ED Medications  sodium chloride flush (NS) 0.9 % injection 3 mL (3 mLs Intravenous Not Given 02/18/19 2126)     Initial Impression / Assessment and Plan / ED Course  I have reviewed the triage vital signs and the nursing notes.  Pertinent labs & imaging results that were available during my care of the patient were reviewed by me and considered in my medical decision making (see chart for details).  Work-up reveals no evidence for a cardiac etiology.  Patient is in a normal sinus rhythm with no changes from most recent EKG on file.  Patient symptoms are atypical for cardiac pain with no EKG changes or troponin  elevation in 1 week.  At this point, I feel as though patient is appropriate for discharge with outpatient cardiology follow-up.  She will be given the number for the cardiology clinic to arrange.  She does describe increased stress over the issues described in the HPI.  This very well could be the cause of her symptoms as nothing in the work-up as given an alternate diagnosis.  Either way, I feel as though she would benefit from cardiology referral.  She understands to return in the meantime if symptoms worsen.  Final Clinical Impressions(s) / ED Diagnoses   Final diagnoses:  None    ED Discharge Orders    None       Veryl Speak, MD 02/18/19 2202

## 2019-02-18 NOTE — ED Notes (Signed)
All appropriate discharge materials reviewed at length with patient. Time for questions provided. Pt has no other questions at this time and verbalizes understanding of all provided materials.  

## 2019-02-18 NOTE — ED Triage Notes (Signed)
Pt states she has been having palpitations for a week. Endorses some chest heaviness.

## 2019-02-18 NOTE — Telephone Encounter (Signed)
Patient states she is having palpitations for 1-2 weeks- she states she has been feeling a heaviness in her chest and she does have to catch her breath every few minutes- sent to ED.  Reason for Disposition . Difficulty breathing  Answer Assessment - Initial Assessment Questions 1. DESCRIPTION: "Please describe your heart rate or heart beat that you are having" (e.g., fast/slow, regular/irregular, skipped or extra beats, "palpitations")     Rapid palpation 2. ONSET: "When did it start?" (Minutes, hours or days)      1 week 3. DURATION: "How long does it last" (e.g., seconds, minutes, hours)     hours 4. PATTERN "Does it come and go, or has it been constant since it started?"  "Does it get worse with exertion?"   "Are you feeling it now?"     Mainly at night- some in the day, yes, yes 5. TAP: "Using your hand, can you tap out what you are feeling on a chair or table in front of you, so that I can hear?" (Note: not all patients can do this)       Some heaviness- feels like pressure 6. HEART RATE: "Can you tell me your heart rate?" "How many beats in 15 seconds?"  (Note: not all patients can do this)       Patient in car 7. RECURRENT SYMPTOM: "Have you ever had this before?" If so, ask: "When was the last time?" and "What happened that time?"      No- just started 1 week ago 8. CAUSE: "What do you think is causing the palpitations?"     Not sure 9. CARDIAC HISTORY: "Do you have any history of heart disease?" (e.g., heart attack, angina, bypass surgery, angioplasty, arrhythmia)      no 10. OTHER SYMPTOMS: "Do you have any other symptoms?" (e.g., dizziness, chest pain, sweating, difficulty breathing)       Sometimes breathing 11. PREGNANCY: "Is there any chance you are pregnant?" "When was your last menstrual period?"       n/a  Protocols used: HEART RATE AND HEARTBEAT QUESTIONS-A-AH

## 2019-02-18 NOTE — Discharge Instructions (Addendum)
Follow-up with cardiology in the next few days.  The contact information for the [facility] cardiology clinic has been provided in this discharge summary for you to call and make these arrangements.  Return to the emergency department in the meantime if your symptoms significantly worsen or change.

## 2019-02-19 ENCOUNTER — Telehealth: Payer: Self-pay

## 2019-02-19 DIAGNOSIS — R002 Palpitations: Secondary | ICD-10-CM

## 2019-02-19 NOTE — Telephone Encounter (Signed)
Copied from [place] [phone]. Topic: Referral - Request for Referral >> Feb 19, 2019 12:21 PM Erick Blinks wrote: Has patient seen PCP for this complaint? Yes.   *If NO, is insurance requiring patient see PCP for this issue before PCP can refer them? Referral for which specialty: Cardiology Preferred provider/office: Highest recommended  Reason for referral: Heart palpitations

## 2019-02-20 NOTE — Telephone Encounter (Signed)
Referral done

## 2019-02-20 NOTE — Addendum Note (Signed)
Addended by: Biagio Borg on: 02/20/2019 12:35 PM   Modules accepted: Orders

## 2019-03-15 ENCOUNTER — Ambulatory Visit: Payer: Medicare Other | Admitting: Cardiology

## 2019-03-18 ENCOUNTER — Ambulatory Visit: Payer: Medicare Other | Admitting: Cardiology

## 2019-03-26 ENCOUNTER — Encounter: Payer: Self-pay | Admitting: Internal Medicine

## 2019-07-16 ENCOUNTER — Encounter: Payer: Self-pay | Admitting: General Practice

## 2019-07-19 ENCOUNTER — Other Ambulatory Visit: Payer: Self-pay

## 2019-07-20 ENCOUNTER — Other Ambulatory Visit: Payer: Self-pay | Admitting: Internal Medicine

## 2019-07-20 NOTE — Telephone Encounter (Signed)
Please refill as per office routine med refill policy (all routine meds refilled for 3 mo or monthly per pt preference up to one year from last visit, then month to month grace period for 3 mo, then further med refills will have to be denied)  

## 2019-07-22 ENCOUNTER — Encounter: Payer: Self-pay | Admitting: Obstetrics and Gynecology

## 2019-07-22 ENCOUNTER — Ambulatory Visit: Payer: Medicare PPO | Admitting: Obstetrics and Gynecology

## 2019-07-22 ENCOUNTER — Other Ambulatory Visit: Payer: Self-pay

## 2019-07-22 VITALS — BP 136/80 | Ht 65.0 in | Wt 179.0 lb

## 2019-07-22 DIAGNOSIS — Z124 Encounter for screening for malignant neoplasm of cervix: Secondary | ICD-10-CM

## 2019-07-22 DIAGNOSIS — Z9289 Personal history of other medical treatment: Secondary | ICD-10-CM

## 2019-07-22 DIAGNOSIS — Z9189 Other specified personal risk factors, not elsewhere classified: Secondary | ICD-10-CM

## 2019-07-22 DIAGNOSIS — Z01419 Encounter for gynecological examination (general) (routine) without abnormal findings: Secondary | ICD-10-CM

## 2019-07-22 NOTE — Addendum Note (Signed)
Addended by: Nelva Nay on: 07/22/2019 11:38 AM   Modules accepted: Orders

## 2019-07-22 NOTE — Progress Notes (Signed)
Kelli Jones 01/21/1951 [id number]  SUBJECTIVE:  69 y.o. G1P1001 female for annual routine gynecologic exam and Pap smear. She has no gynecologic concerns.   Current Outpatient Medications  Medication Sig Dispense Refill  . acyclovir ointment (ZOVIRAX) 5 % Apply 1 application topically every 3 (three) hours. 3 g 5  . amLODipine (NORVASC) 10 MG tablet Take 1 tablet (10 mg total) by mouth daily. 90 tablet 1  . aspirin 81 MG EC tablet Take 1 tablet (81 mg total) by mouth daily. Swallow whole. 30 tablet 12  . Biotin w/ Vitamins C & E (HAIR/SKIN/NAILS PO) Take by mouth as needed.    . Calcium Carb-Cholecalciferol (CALCIUM + D3 PO) Take by mouth daily.    Marland Kitchen losartan-hydrochlorothiazide (HYZAAR) 100-25 MG tablet Take 1 tablet by mouth daily. 90 tablet 1  . lovastatin (MEVACOR) 40 MG tablet Take 1 tablet (40 mg total) by mouth at bedtime. 90 tablet 1  . meloxicam (MOBIC) 15 MG tablet TAKE 1 TABLET(15 MG) BY MOUTH DAILY 30 tablet 5  . Multiple Vitamin (MULTIVITAMIN) tablet Take 1 tablet by mouth. Alive Women's MVI-Take one daily    . potassium chloride (K-DUR) 10 MEQ tablet Take 1 tablet (10 mEq total) by mouth daily. 90 tablet 3  . cyclobenzaprine (FLEXERIL) 5 MG tablet Take 1 tablet (5 mg total) by mouth 3 (three) times daily as needed for muscle spasms. (Patient not taking: Reported on 07/22/2019) 60 tablet 1  . valACYclovir (VALTREX) 500 MG tablet Take 1 tablet (500 mg total) by mouth 2 (two) times daily. With outbreak (Patient not taking: Reported on 07/22/2019) 60 tablet 12   No current facility-administered medications for this visit.   Allergies: Patient has no known allergies.  No LMP recorded. Patient is postmenopausal.  Past medical history,surgical history, problem list, medications, allergies, family history and social history were all reviewed and documented as reviewed in the EPIC chart.  ROS:  Feeling well. No dyspnea or chest pain on exertion.  No abdominal pain, change in bowel  habits, black or bloody stools.  No urinary tract symptoms. GYN ROS: no abnormal bleeding, pelvic pain or discharge, no breast pain or new or enlarging lumps on self exam. No neurological complaints.   OBJECTIVE:  BP 136/80   Ht 5\' 5"  (1.651 m)   Wt 179 lb (81.2 kg)   BMI 29.79 kg/m  The patient appears well, alert, oriented x 3, in no distress. ENT normal.  Neck supple. No cervical or supraclavicular adenopathy or thyromegaly.  Lungs are clear, good air entry, no wheezes, rhonchi or rales. S1 and S2 normal, no murmurs, regular rate and rhythm.  Abdomen soft without tenderness, guarding, mass or organomegaly.  Neurological is normal, no focal findings.  BREAST EXAM: breasts appear normal, no suspicious masses, no skin or nipple changes or axillary nodes  PELVIC EXAM: VULVA: normal appearing vulva with no masses, tenderness or lesions, atrophic changes noted, VAGINA: normal appearing vagina with normal color and discharge, no lesions, CERVIX: normal appearing cervix without discharge or lesions, UTERUS: uterus is normal size, shape, consistency and nontender, ADNEXA: normal adnexa in size, nontender and no masses, PAP: Pap smear done today, thin-prep method  Chaperone: Caryn Bee present during the examination  ASSESSMENT:  69 y.o. G1P1001 here for annual gynecologic exam  PLAN:   1. Postmenopausal.  No hot flashes or night sweats.  No vaginal bleeding. 2. Pap smear 2016.  No significant history of abnormal Pap smears.  Option to discontinue screening based on  age guideline is reviewed, but she would like to continue so a Pap smear is collected today. 3. Mammogram 10/2018.  Normal breast exam today.  She is reminded to schedule an annual mammogram this year when due. 4. Colonoscopy 2017.  Recommended that she follow up at the recommended interval.   5. DEXA 01/2019 normal.  Next DEXA recommended in 2025 at the 5 year interval.  6. Health maintenance.  No labs today as she normally has  these completed elsewhere.  Return annually or sooner, prn.  Joseph Pierini MD 07/22/19

## 2019-07-23 LAB — PAP IG W/ RFLX HPV ASCU

## 2019-07-31 ENCOUNTER — Other Ambulatory Visit: Payer: Self-pay | Admitting: Internal Medicine

## 2019-07-31 NOTE — Telephone Encounter (Signed)
Please refill as per office routine med refill policy (all routine meds refilled for 3 mo or monthly per pt preference up to one year from last visit, then month to month grace period for 3 mo, then further med refills will have to be denied)  

## 2019-08-01 ENCOUNTER — Other Ambulatory Visit: Payer: Self-pay | Admitting: Internal Medicine

## 2019-08-28 ENCOUNTER — Other Ambulatory Visit: Payer: Self-pay | Admitting: Internal Medicine

## 2019-08-28 NOTE — Telephone Encounter (Signed)
Please refill as per office routine med refill policy (all routine meds refilled for 3 mo or monthly per pt preference up to one year from last visit, then month to month grace period for 3 mo, then further med refills will have to be denied)  

## 2019-10-09 ENCOUNTER — Other Ambulatory Visit: Payer: Self-pay | Admitting: Internal Medicine

## 2019-10-09 NOTE — Telephone Encounter (Signed)
Please refill as per office routine med refill policy (all routine meds refilled for 3 mo or monthly per pt preference up to one year from last visit, then month to month grace period for 3 mo, then further med refills will have to be denied)  

## 2019-12-11 LAB — HM MAMMOGRAPHY

## 2019-12-16 ENCOUNTER — Encounter: Payer: Self-pay | Admitting: Internal Medicine

## 2020-01-22 IMAGING — DX DG CHEST 2V
2 series · 2 of 2 positions shown · non-contrast
Comparison: None.

CLINICAL DATA: Chest pain and palpitations over the last 10 days.

EXAM:
CHEST - 2 VIEW

[w chest pa]
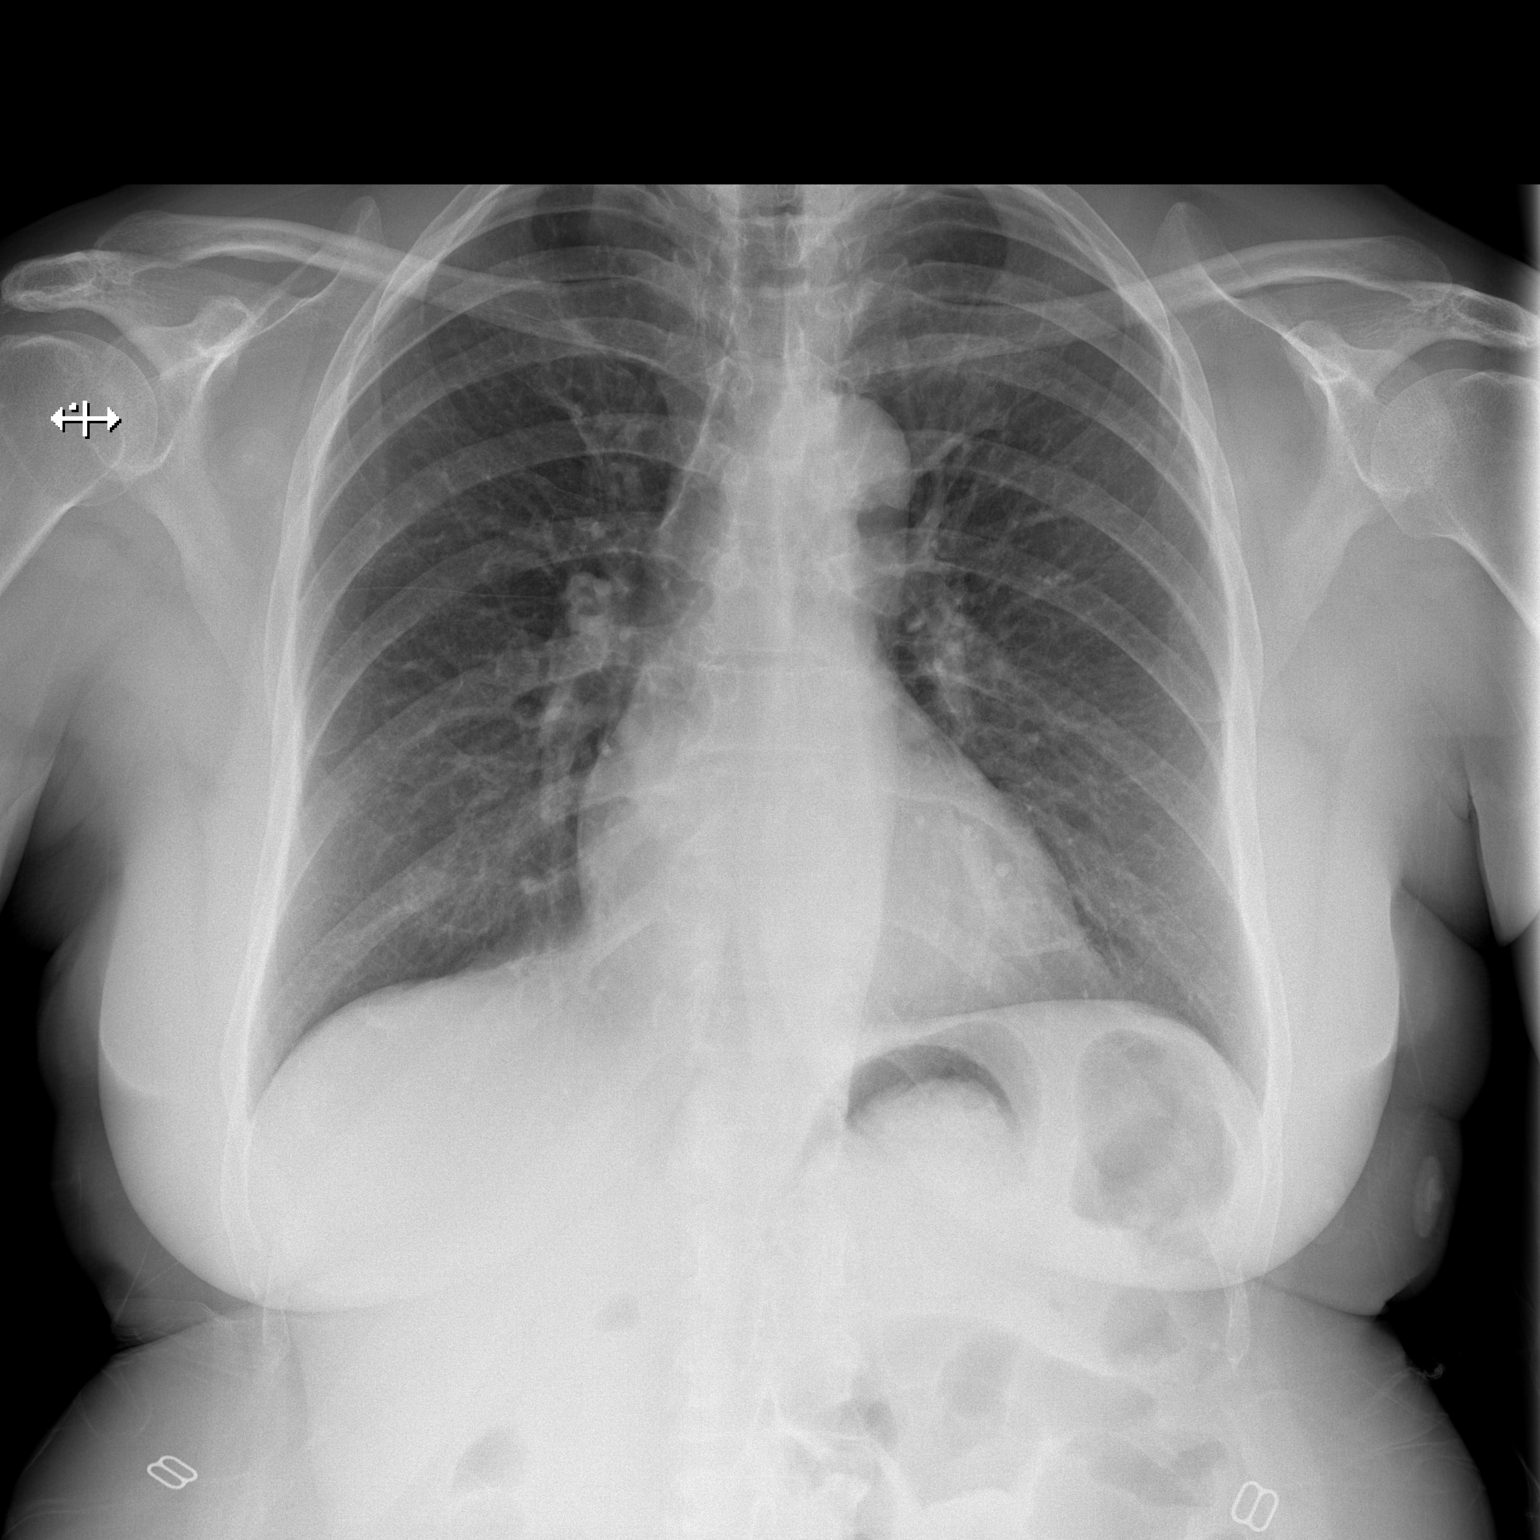

[w chest lat]
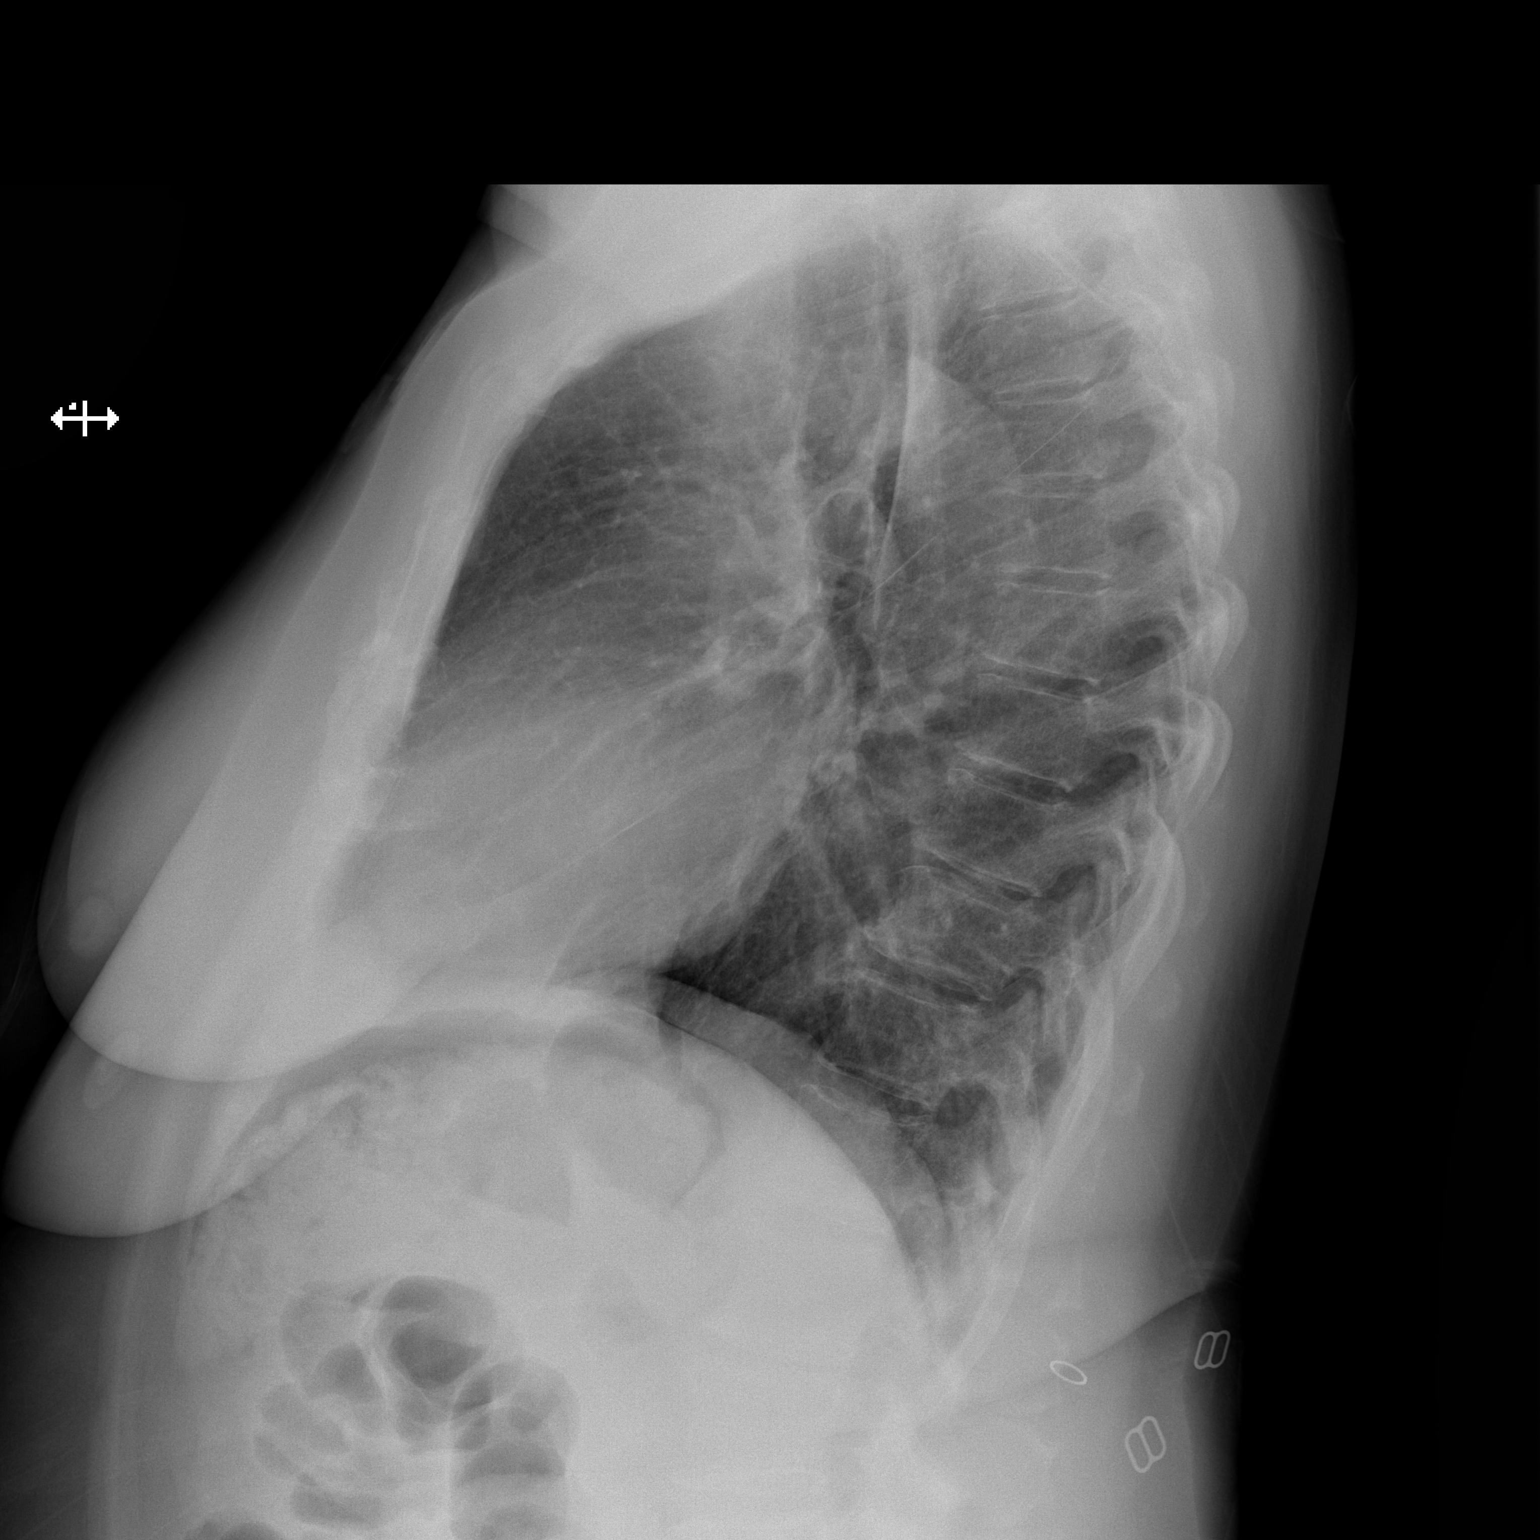

[2 of 2 positions shown; findings below may reference images not displayed]

FINDINGS: The heart size and mediastinal contours are within normal limits.
Both lungs are clear. The visualized skeletal structures are
unremarkable.
IMPRESSION: No active cardiopulmonary disease.

## 2020-03-09 ENCOUNTER — Ambulatory Visit: Payer: Medicare PPO | Attending: Internal Medicine

## 2020-03-09 DIAGNOSIS — Z23 Encounter for immunization: Secondary | ICD-10-CM

## 2020-03-09 NOTE — Progress Notes (Signed)
   Covid-19 Vaccination Clinic  Name:  Kelli Jones    MRN: [id number] DOB: 08-08-1950  03/09/2020  Kelli Jones was observed post Covid-19 immunization for 15 minutes without incident. She was provided with Vaccine Information Sheet and instruction to access the V-Safe system.   Kelli Jones was instructed to call 911 with any severe reactions post vaccine: Marland Kitchen Difficulty breathing  . Swelling of face and throat  . A fast heartbeat  . A bad rash all over body  . Dizziness and weakness   Immunizations Administered    Name Date Dose VIS Date Route   Pfizer COVID-19 Vaccine 03/09/2020  1:56 PM 0.3 mL 01/22/2020 Intramuscular   Manufacturer: [place]   Lot: [id number]   NDC: [address]-1

## 2020-09-02 ENCOUNTER — Ambulatory Visit: Payer: Medicare PPO | Admitting: Internal Medicine

## 2020-10-07 ENCOUNTER — Encounter: Payer: Medicare PPO | Admitting: Internal Medicine

## 2020-10-13 ENCOUNTER — Other Ambulatory Visit: Payer: Self-pay

## 2020-10-13 ENCOUNTER — Ambulatory Visit (INDEPENDENT_AMBULATORY_CARE_PROVIDER_SITE_OTHER): Payer: Medicare PPO | Admitting: Internal Medicine

## 2020-10-13 ENCOUNTER — Encounter: Payer: Self-pay | Admitting: Internal Medicine

## 2020-10-13 VITALS — BP 142/80 | HR 93 | Temp 97.5°F | Ht 65.0 in | Wt 173.0 lb

## 2020-10-13 DIAGNOSIS — E785 Hyperlipidemia, unspecified: Secondary | ICD-10-CM | POA: Diagnosis not present

## 2020-10-13 DIAGNOSIS — I10 Essential (primary) hypertension: Secondary | ICD-10-CM | POA: Diagnosis not present

## 2020-10-13 DIAGNOSIS — Z0001 Encounter for general adult medical examination with abnormal findings: Secondary | ICD-10-CM

## 2020-10-13 DIAGNOSIS — E538 Deficiency of other specified B group vitamins: Secondary | ICD-10-CM | POA: Diagnosis not present

## 2020-10-13 DIAGNOSIS — R7302 Impaired glucose tolerance (oral): Secondary | ICD-10-CM | POA: Diagnosis not present

## 2020-10-13 DIAGNOSIS — E559 Vitamin D deficiency, unspecified: Secondary | ICD-10-CM | POA: Diagnosis not present

## 2020-10-13 LAB — CBC WITH DIFFERENTIAL/PLATELET
Basophils Absolute: 0.1 10*3/uL (ref 0.0–0.1)
Basophils Relative: 0.7 % (ref 0.0–3.0)
Eosinophils Absolute: 0 10*3/uL (ref 0.0–0.7)
Eosinophils Relative: 0.5 % (ref 0.0–5.0)
HCT: 40.2 % (ref 36.0–46.0)
Hemoglobin: 13.1 g/dL (ref 12.0–15.0)
Lymphocytes Relative: 39.6 % (ref 12.0–46.0)
Lymphs Abs: 3.2 10*3/uL (ref 0.7–4.0)
MCHC: 32.7 g/dL (ref 30.0–36.0)
MCV: 86.8 fl (ref 78.0–100.0)
Monocytes Absolute: 0.6 10*3/uL (ref 0.1–1.0)
Monocytes Relative: 7.8 % (ref 3.0–12.0)
Neutro Abs: 4.2 10*3/uL (ref 1.4–7.7)
Neutrophils Relative %: 51.4 % (ref 43.0–77.0)
Platelets: 280 10*3/uL (ref 150.0–400.0)
RBC: 4.63 Mil/uL (ref 3.87–5.11)
RDW: 14.7 % (ref 11.5–15.5)
WBC: 8.2 10*3/uL (ref 4.0–10.5)

## 2020-10-13 LAB — LIPID PANEL
Cholesterol: 213 mg/dL — ABNORMAL HIGH (ref 0–200)
HDL: 60 mg/dL (ref >39.00)
LDL Cholesterol: 139 mg/dL — ABNORMAL HIGH (ref 0–99)
NonHDL: 152.91
Total CHOL/HDL Ratio: 4
Triglycerides: 72 mg/dL (ref 0.0–149.0)
VLDL: 14.4 mg/dL (ref 0.0–40.0)

## 2020-10-13 LAB — HEPATIC FUNCTION PANEL
ALT: 14 U/L (ref 0–35)
AST: 21 U/L (ref 0–37)
Albumin: 4.2 g/dL (ref 3.5–5.2)
Alkaline Phosphatase: 77 U/L (ref 39–117)
Bilirubin, Direct: 0.1 mg/dL (ref 0.0–0.3)
Total Bilirubin: 0.4 mg/dL (ref 0.2–1.2)
Total Protein: 8 g/dL (ref 6.0–8.3)

## 2020-10-13 LAB — VITAMIN D 25 HYDROXY (VIT D DEFICIENCY, FRACTURES): VITD: 31.66 ng/mL (ref 30.00–100.00)

## 2020-10-13 LAB — BASIC METABOLIC PANEL
BUN: 12 mg/dL (ref 6–23)
CO2: 27 mEq/L (ref 19–32)
Calcium: 9.6 mg/dL (ref 8.4–10.5)
Chloride: 105 mEq/L (ref 96–112)
Creatinine, Ser: 0.9 mg/dL (ref 0.40–1.20)
GFR: 63.87 mL/min (ref >60.00)
Glucose, Bld: 84 mg/dL (ref 70–99)
Potassium: 3.7 mEq/L (ref 3.5–5.1)
Sodium: 139 mEq/L (ref 135–145)

## 2020-10-13 LAB — HEMOGLOBIN A1C: Hgb A1c MFr Bld: 6 % (ref 4.6–6.5)

## 2020-10-13 LAB — VITAMIN B12: Vitamin B-12: >1550 pg/mL — ABNORMAL HIGH (ref 211–911)

## 2020-10-13 LAB — TSH: TSH: 1.19 u[IU]/mL (ref 0.35–5.50)

## 2020-10-13 MED ORDER — POTASSIUM CHLORIDE ER 10 MEQ PO TBCR: 10.0000 meq | EXTENDED_RELEASE_TABLET | Freq: Every day | ORAL | 3 refills | Status: DC

## 2020-10-13 MED ORDER — VALACYCLOVIR HCL 500 MG PO TABS: 500.0000 mg | ORAL_TABLET | Freq: Two times a day (BID) | ORAL | 12 refills | Status: DC

## 2020-10-13 MED ORDER — METOPROLOL SUCCINATE ER 25 MG PO TB24: 25.0000 mg | ORAL_TABLET | Freq: Every day | ORAL | 3 refills | Status: DC

## 2020-10-13 MED ORDER — LOVASTATIN 40 MG PO TABS: 40.0000 mg | ORAL_TABLET | Freq: Every day | ORAL | 3 refills | Status: DC

## 2020-10-13 MED ORDER — AMLODIPINE BESYLATE 10 MG PO TABS: 10.0000 mg | ORAL_TABLET | Freq: Every day | ORAL | 3 refills | Status: DC

## 2020-10-13 MED ORDER — LOSARTAN POTASSIUM-HCTZ 100-25 MG PO TABS: 1.0000 | ORAL_TABLET | Freq: Every day | ORAL | 3 refills | Status: DC

## 2020-10-13 NOTE — Progress Notes (Signed)
Patient ID: Kelli Jones, female   DOB: 1950/04/11, 70 y.o.   MRN: [id number]         Chief Complaint:: wellness exam and uncontrolled htn, hyperglycemia, hld       HPI:  Kelli Jones is a 70 y.o. female here for wellness exam; declines covid booster and shingrix, o/w up to date with preventive referrals and immunizations                         Also going to class at the gym 3 times per wk.  BP at home has been mild elevated similar to today.  Pt denies chest pain, increased sob or doe, wheezing, orthopnea, PND, increased LE swelling, palpitations, dizziness or syncope.   Pt denies polydipsia, polyuria, or new focal neuro s/s.   Pt denies fever, wt loss, night sweats, loss of appetite, or other constitutional symptoms  No other new complaints   Wt Readings from Last 3 Encounters:  10/13/20 173 lb (78.5 kg)  07/22/19 179 lb (81.2 kg)  05/11/18 176 lb (79.8 kg)   BP Readings from Last 3 Encounters:  10/13/20 (!) 142/80  07/22/19 136/80  02/18/19 (!) 153/90   Immunization History  Administered Date(s) Administered   H1N1 02/04/2008   Influenza Split 01/31/2012, 12/12/2012   Influenza Whole 02/23/2007   Influenza, High Dose Seasonal PF 12/28/2017   Influenza,inj,Quad PF,6+ Mos 12/24/2014, 01/08/2019   PFIZER(Purple Top)SARS-COV-2 Vaccination 03/09/2020   Pneumococcal Conjugate-13 11/07/2013   Pneumococcal Polysaccharide-23 12/24/2014   Tdap 07/06/2012   There are no preventive care reminders to display for this patient.     Past Medical History:  Diagnosis Date   Arthritis    in hands   Dyslipidemia    Hypertension    PID (pelvic inflammatory disease)    Tubal obstruction   STD (sexually transmitted disease)    HSV   Past Surgical History:  Procedure Laterality Date   BREAST BIOPSY  1990   neg/   BREAST LUMPECTOMY WITH RADIOACTIVE SEED LOCALIZATION Left 08/20/2015   Procedure: BREAST LUMPECTOMY WITH RADIOACTIVE SEED LOCALIZATION;  Surgeon: Donnie Mesa, MD;   Location: [place];  Service: General;  Laterality: Left;   DILATION AND CURETTAGE OF UTERUS  1979   PELVIC LAPAROSCOPY     DL tubal lavage   TONSILLECTOMY AND ADENOIDECTOMY      reports that she has never smoked. She has never used smokeless tobacco. She reports previous alcohol use. She reports that she does not use drugs. family history includes Diabetes in her mother; Heart disease in her brother; Hypertension in her mother; Kidney cancer in her father. No Known Allergies Current Outpatient Medications on File Prior to Visit  Medication Sig Dispense Refill   acyclovir ointment (ZOVIRAX) 5 % Apply 1 application topically every 3 (three) hours. 3 g 5   aspirin 81 MG EC tablet Take 1 tablet (81 mg total) by mouth daily. Swallow whole. 30 tablet 12   Biotin w/ Vitamins C & E (HAIR/SKIN/NAILS PO) Take by mouth as needed.     Calcium Carb-Cholecalciferol (CALCIUM + D3 PO) Take by mouth daily.     cyclobenzaprine (FLEXERIL) 5 MG tablet Take 1 tablet (5 mg total) by mouth 3 (three) times daily as needed for muscle spasms. 60 tablet 1   meloxicam (MOBIC) 15 MG tablet TAKE 1 TABLET(15 MG) BY MOUTH DAILY 30 tablet 5   Multiple Vitamin (MULTIVITAMIN) tablet Take 1 tablet by mouth. Alive  Women's MVI-Take one daily     No current facility-administered medications on file prior to visit.        ROS:  All others reviewed and negative.  Objective        PE:  BP (!) 142/80 (BP Location: Left Arm, Patient Position: Sitting, Cuff Size: Normal)   Pulse 93   Temp (!) 97.5 F (36.4 C) (Oral)   Ht 5\' 5"  (1.651 m)   Wt 173 lb (78.5 kg)   SpO2 97%   BMI 28.79 kg/m                 Constitutional: Pt appears in NAD               HENT: Head: NCAT.                Right Ear: External ear normal.                 Left Ear: External ear normal.                Eyes: . Pupils are equal, round, and reactive to light. Conjunctivae and EOM are normal               Nose: without d/c or  deformity               Neck: Neck supple. Gross normal ROM               Cardiovascular: Normal rate and regular rhythm.                 Pulmonary/Chest: Effort normal and breath sounds without rales or wheezing.                Abd:  Soft, NT, ND, + BS, no organomegaly               Neurological: Pt is alert. At baseline orientation, motor grossly intact               Skin: Skin is warm. No rashes, no other new lesions, LE edema - none               Psychiatric: Pt behavior is normal without agitation   Micro: none  Cardiac tracings I have personally interpreted today:  none  Pertinent Radiological findings (summarize): none   Lab Results  Component Value Date   WBC 8.2 10/13/2020   HGB 13.1 10/13/2020   HCT 40.2 10/13/2020   PLT 280.0 10/13/2020   GLUCOSE 84 10/13/2020   CHOL 213 (H) 10/13/2020   TRIG 72.0 10/13/2020   HDL 60.00 10/13/2020   LDLCALC 139 (H) 10/13/2020   ALT 14 10/13/2020   AST 21 10/13/2020   NA 139 10/13/2020   K 3.7 10/13/2020   CL 105 10/13/2020   CREATININE 0.90 10/13/2020   BUN 12 10/13/2020   CO2 27 10/13/2020   TSH 1.19 10/13/2020   HGBA1C 6.0 10/13/2020   Assessment/Plan:  Kelli Jones is a 70 y.o. Black or African American [2] female with  has a past medical history of Arthritis, Dyslipidemia, Hypertension, PID (pelvic inflammatory disease), and STD (sexually transmitted disease).  Encounter for well adult exam with abnormal findings Age and sex appropriate education and counseling updated with regular exercise and diet Referrals for preventative services - none needed Immunizations addressed - declines shingrix and covid booster Smoking counseling  - none needed Evidence for depression or other mood disorder - none significant Most recent  labs reviewed. I have personally reviewed and have noted: 1) the patient's medical and social history 2) The patient's current medications and supplements 3) The patient's height, weight, and BMI have  been recorded in the chart   Dyslipidemia Lab Results  Component Value Date   LDLCALC 139 (H) 10/13/2020   Uncontrolled,, pt encourage for compliance with current statin crestor 40 after change of lovastatin   Vitamin D deficiency Last vitamin D Lab Results  Component Value Date   VD25OH 31.66 10/13/2020   Low normal, to start oral replacement   Impaired glucose tolerance Lab Results  Component Value Date   HGBA1C 6.0 10/13/2020   Stable, pt to continue current medical treatment  - diet   Hypertension, uncontrolled BP Readings from Last 3 Encounters:  10/13/20 (!) 142/80  07/22/19 136/80  02/18/19 (!) 153/90   Uncontrolled, to add toprol xl 25 medical treatment amlodipein, hyzaar   Current Outpatient Medications (Cardiovascular):    metoprolol succinate (TOPROL-XL) 25 MG 24 hr tablet, Take 1 tablet (25 mg total) by mouth daily.   amLODipine (NORVASC) 10 MG tablet, Take 1 tablet (10 mg total) by mouth daily.   losartan-hydrochlorothiazide (HYZAAR) 100-25 MG tablet, Take 1 tablet by mouth daily.   rosuvastatin (CRESTOR) 40 MG tablet, Take 1 tablet (40 mg total) by mouth daily.   Current Outpatient Medications (Analgesics):    aspirin 81 MG EC tablet, Take 1 tablet (81 mg total) by mouth daily. Swallow whole.   meloxicam (MOBIC) 15 MG tablet, TAKE 1 TABLET(15 MG) BY MOUTH DAILY   Current Outpatient Medications (Other):    acyclovir ointment (ZOVIRAX) 5 %, Apply 1 application topically every 3 (three) hours.   Biotin w/ Vitamins C & E (HAIR/SKIN/NAILS PO), Take by mouth as needed.   Calcium Carb-Cholecalciferol (CALCIUM + D3 PO), Take by mouth daily.   cyclobenzaprine (FLEXERIL) 5 MG tablet, Take 1 tablet (5 mg total) by mouth 3 (three) times daily as needed for muscle spasms.   Multiple Vitamin (MULTIVITAMIN) tablet, Take 1 tablet by mouth. Alive Women's MVI-Take one daily   Cholecalciferol 50 MCG (2000 UT) TABS, 1 tab by mouth once daily   potassium chloride  (KLOR-CON) 10 MEQ tablet, Take 1 tablet (10 mEq total) by mouth daily.   valACYclovir (VALTREX) 500 MG tablet, Take 1 tablet (500 mg total) by mouth 2 (two) times daily. With outbreak  Followup: Return in about 1 year (around 10/13/2021).  Cathlean Cower, MD 10/18/2020 9:33 AM [facility]

## 2020-10-13 NOTE — Patient Instructions (Signed)
Please take all new medication as prescribed - the toprol XL 25 mg per day  Please continue all other medications as before, and refills have been done if requested.  Please have the pharmacy call with any other refills you may need.  Please continue your efforts at being more active, low cholesterol diet, and weight control.  You are otherwise up to date with prevention measures today.  Please keep your appointments with your specialists as you may have planned  Please go to the LAB at the blood drawing area for the tests to be done  You will be contacted by phone if any changes need to be made immediately.  Otherwise, you will receive a letter about your results with an explanation, but please check with MyChart first.  Please remember to sign up for MyChart if you have not done so, as this will be important to you in the future with finding out test results, communicating by private email, and scheduling acute appointments online when needed.  Please make an Appointment to return for your 1 year visit, or sooner if needed

## 2020-10-14 ENCOUNTER — Other Ambulatory Visit: Payer: Self-pay | Admitting: Internal Medicine

## 2020-10-14 ENCOUNTER — Encounter: Payer: Self-pay | Admitting: Internal Medicine

## 2020-10-14 LAB — URINALYSIS, ROUTINE W REFLEX MICROSCOPIC
Bilirubin Urine: NEGATIVE
Hgb urine dipstick: NEGATIVE
Ketones, ur: NEGATIVE
Nitrite: NEGATIVE
RBC / HPF: NONE SEEN (ref 0–?)
Specific Gravity, Urine: 1.025 (ref 1.000–1.030)
Total Protein, Urine: NEGATIVE
Urine Glucose: NEGATIVE
Urobilinogen, UA: 0.2 (ref 0.0–1.0)
pH: 5.5 (ref 5.0–8.0)

## 2020-10-14 MED ORDER — ROSUVASTATIN CALCIUM 40 MG PO TABS: 40.0000 mg | ORAL_TABLET | Freq: Every day | ORAL | 3 refills | Status: DC

## 2020-10-14 MED ORDER — CHOLECALCIFEROL 50 MCG (2000 UT) PO TABS: ORAL_TABLET | ORAL | 99 refills | Status: AC

## 2020-10-18 ENCOUNTER — Encounter: Payer: Self-pay | Admitting: Internal Medicine

## 2020-10-18 DIAGNOSIS — E559 Vitamin D deficiency, unspecified: Secondary | ICD-10-CM | POA: Insufficient documentation

## 2020-10-18 NOTE — Assessment & Plan Note (Addendum)
Lab Results  Component Value Date   LDLCALC 139 (H) 10/13/2020   Uncontrolled,, pt encourage for compliance with current statin crestor 40 after change of lovastatin

## 2020-10-18 NOTE — Assessment & Plan Note (Signed)
Last vitamin D Lab Results  Component Value Date   VD25OH 31.66 10/13/2020   Low normal, to start oral replacement

## 2020-10-18 NOTE — Assessment & Plan Note (Signed)
Age and sex appropriate education and counseling updated with regular exercise and diet °Referrals for preventative services - none needed °Immunizations addressed - declines shingrix and covid booster °Smoking counseling  - none needed °Evidence for depression or other mood disorder - none significant °Most recent labs reviewed. °I have personally reviewed and have noted: °1) the patient's medical and social history °2) The patient's current medications and supplements °3) The patient's height, weight, and BMI have been recorded in the chart ° °

## 2020-10-18 NOTE — Assessment & Plan Note (Signed)
BP Readings from Last 3 Encounters:  10/13/20 (!) 142/80  07/22/19 136/80  02/18/19 (!) 153/90   Uncontrolled, to add toprol xl 25 medical treatment amlodipein, hyzaar   Current Outpatient Medications (Cardiovascular):  .  metoprolol succinate (TOPROL-XL) 25 MG 24 hr tablet, Take 1 tablet (25 mg total) by mouth daily. Marland Kitchen  amLODipine (NORVASC) 10 MG tablet, Take 1 tablet (10 mg total) by mouth daily. Marland Kitchen  losartan-hydrochlorothiazide (HYZAAR) 100-25 MG tablet, Take 1 tablet by mouth daily. .  rosuvastatin (CRESTOR) 40 MG tablet, Take 1 tablet (40 mg total) by mouth daily.   Current Outpatient Medications (Analgesics):  .  aspirin 81 MG EC tablet, Take 1 tablet (81 mg total) by mouth daily. Swallow whole. .  meloxicam (MOBIC) 15 MG tablet, TAKE 1 TABLET(15 MG) BY MOUTH DAILY   Current Outpatient Medications (Other):  .  acyclovir ointment (ZOVIRAX) 5 %, Apply 1 application topically every 3 (three) hours. .  Biotin w/ Vitamins C & E (HAIR/SKIN/NAILS PO), Take by mouth as needed. .  Calcium Carb-Cholecalciferol (CALCIUM + D3 PO), Take by mouth daily. .  cyclobenzaprine (FLEXERIL) 5 MG tablet, Take 1 tablet (5 mg total) by mouth 3 (three) times daily as needed for muscle spasms. .  Multiple Vitamin (MULTIVITAMIN) tablet, Take 1 tablet by mouth. Alive Women's MVI-Take one daily .  Cholecalciferol 50 MCG (2000 UT) TABS, 1 tab by mouth once daily .  potassium chloride (KLOR-CON) 10 MEQ tablet, Take 1 tablet (10 mEq total) by mouth daily. .  valACYclovir (VALTREX) 500 MG tablet, Take 1 tablet (500 mg total) by mouth 2 (two) times daily. With outbreak

## 2020-10-18 NOTE — Assessment & Plan Note (Signed)
Lab Results  Component Value Date   HGBA1C 6.0 10/13/2020   Stable, pt to continue current medical treatment  - diet

## 2020-11-05 ENCOUNTER — Telehealth: Payer: Self-pay | Admitting: Internal Medicine

## 2020-11-05 NOTE — Telephone Encounter (Signed)
LVM for pt to rtn my call to schedule AWV with [facility]. Please schedule this appt if pt calls the office.  °

## 2020-11-16 ENCOUNTER — Encounter: Payer: Self-pay | Admitting: Nurse Practitioner

## 2020-11-16 ENCOUNTER — Other Ambulatory Visit: Payer: Self-pay

## 2020-11-16 ENCOUNTER — Ambulatory Visit (INDEPENDENT_AMBULATORY_CARE_PROVIDER_SITE_OTHER): Payer: Medicare PPO | Admitting: Nurse Practitioner

## 2020-11-16 VITALS — BP 122/80 | Ht 64.0 in | Wt 173.0 lb

## 2020-11-16 DIAGNOSIS — Z01419 Encounter for gynecological examination (general) (routine) without abnormal findings: Secondary | ICD-10-CM | POA: Diagnosis not present

## 2020-11-16 DIAGNOSIS — Z780 Asymptomatic menopausal state: Secondary | ICD-10-CM

## 2020-11-16 NOTE — Progress Notes (Signed)
   Kelli Jones Aug 07, 1950 [id number]   History:  70 y.o. G1P1001 presents for breast and pelvic exam without GYN complaints. Postmenopausal - no HRT, no bleeding. Normal pap history.   Gynecologic History No LMP recorded. Patient is postmenopausal.   Contraception: post menopausal status Sexually active: Yes  Health Maintenance Last Pap: 07/22/2019. Results were: Normal, 3-year repeat Last mammogram: 12/11/2019. Results were: Normal Last colonoscopy: 2017 Last Dexa: 01/08/2019. Results were: Normal  Past medical history, past surgical history, family history and social history were all reviewed and documented in the EPIC chart. Married. Retired Pharmacist, hospital. 1 son, 3 grandchildren.   ROS:  A ROS was performed and pertinent positives and negatives are included.  Exam:  Vitals:   11/16/20 1105  BP: 122/80  Weight: 173 lb (78.5 kg)  Height: '5\' 4"'$  (1.626 m)   Body mass index is 29.7 kg/m.  General appearance:  Normal Thyroid:  Symmetrical, normal in size, without palpable masses or nodularity. Respiratory  Auscultation:  Clear without wheezing or rhonchi Cardiovascular  Auscultation:  Regular rate, without rubs, murmurs or gallops  Edema/varicosities:  Not grossly evident Abdominal  Soft,nontender, without masses, guarding or rebound.  Liver/spleen:  No organomegaly noted  Hernia:  None appreciated  Skin  Inspection:  Grossly normal Breasts: Examined lying and sitting.   Right: Without masses, retractions, nipple discharge or axillary adenopathy.   Left: Without masses, retractions, nipple discharge or axillary adenopathy. Genitourinary   Inguinal/mons:  Normal without inguinal adenopathy  External genitalia:  Normal appearing vulva with no masses, tenderness, or lesions  BUS/Urethra/Skene's glands:  Normal  Vagina:  Normal appearing with normal color and discharge, no lesions. Atrophic changes  Cervix:  Normal appearing without discharge or lesions  Uterus:  Normal in  size, shape and contour.  Midline and mobile, nontender  Adnexa/parametria:     Rt: Normal in size, without masses or tenderness.   Lt: Normal in size, without masses or tenderness.  Anus and perineum: Normal  Digital rectal exam: Normal sphincter tone without palpated masses or tenderness  Patient informed chaperone available to be present for breast and pelvic exam. Patient has requested no chaperone to be present. Patient has been advised what will be completed during breast and pelvic exam.   Assessment/Plan:  70 y.o. G1P1001 for breast and pelvic exam.   Well female exam with routine gynecological exam - Education provided on SBEs, importance of preventative screenings, current guidelines, high calcium diet, regular exercise, and multivitamin daily. Labs with PCP.   Postmenopausal - no HRT, no bleeding.   Screening for cervical cancer - Normal Pap history. Discussed the option to stop screenings. She would like to continue. Pap due 2024.   Screening for breast cancer - Normal mammogram history.  Continue annual screenings.  Normal breast exam today.  Screening for colon cancer - 2017 colonoscopy. Will repeat at GI's recommended interval.   Screening for osteoporosis - Normal Dexa in 2020. Will plan to repeat in 2025. Continue Vitamin D/Calcium supplement and regular exercise.   Return in 1 year for breast and pelvic exam.   Tamela Gammon DNP, 11:26 AM 11/16/2020

## 2020-12-18 ENCOUNTER — Ambulatory Visit (INDEPENDENT_AMBULATORY_CARE_PROVIDER_SITE_OTHER): Payer: Medicare PPO

## 2020-12-18 DIAGNOSIS — Z0000 Encounter for general adult medical examination without abnormal findings: Secondary | ICD-10-CM

## 2020-12-18 NOTE — Progress Notes (Signed)
I connected with Kelli Jones today by telephone and verified that I am speaking with the correct person using two identifiers. Location patient: home Location provider: work Persons participating in the virtual visit: patient, provider.   I discussed the limitations, risks, security and privacy concerns of performing an evaluation and management service by telephone and the availability of in person appointments. I also discussed with the patient that there may be a patient responsible charge related to this service. The patient expressed understanding and verbally consented to this telephonic visit.    Interactive audio and video telecommunications were attempted between this provider and patient, however failed, due to patient having technical difficulties OR patient did not have access to video capability.  We continued and completed visit with audio only.  Some vital signs may be absent or patient reported.   Time Spent with patient on telephone encounter: 40 minutes  Subjective:   Kelli Jones is a 70 y.o. female who presents for Medicare Annual (Subsequent) preventive examination.  Review of Systems     Cardiac Risk Factors include: advanced age (>16mn, >>75women);dyslipidemia;hypertension;family history of premature cardiovascular disease     Objective:    There were no vitals filed for this visit. There is no height or weight on file to calculate BMI.  Advanced Directives 12/18/2020 02/18/2019 12/28/2017 09/08/2016 08/20/2015 08/12/2015  Does Patient Have a Medical Advance Directive? No No No No No No  Would patient like information on creating a medical advance directive? No - Patient declined No - Patient declined Yes (ED - Information included in AVS) - No - patient declined information -    Current Medications (verified) Outpatient Encounter Medications as of 12/18/2020  Medication Sig   acyclovir ointment (ZOVIRAX) 5 % Apply 1 application topically every 3 (three) hours.    amLODipine (NORVASC) 10 MG tablet Take 1 tablet (10 mg total) by mouth daily.   aspirin 81 MG EC tablet Take 1 tablet (81 mg total) by mouth daily. Swallow whole.   Biotin w/ Vitamins C & E (HAIR/SKIN/NAILS PO) Take by mouth as needed.   Calcium Carb-Cholecalciferol (CALCIUM + D3 PO) Take by mouth daily.   Cholecalciferol 50 MCG (2000 UT) TABS 1 tab by mouth once daily   cyclobenzaprine (FLEXERIL) 5 MG tablet Take 1 tablet (5 mg total) by mouth 3 (three) times daily as needed for muscle spasms. (Patient not taking: Reported on 11/16/2020)   losartan-hydrochlorothiazide (HYZAAR) 100-25 MG tablet Take 1 tablet by mouth daily.   meloxicam (MOBIC) 15 MG tablet TAKE 1 TABLET(15 MG) BY MOUTH DAILY   metoprolol succinate (TOPROL-XL) 25 MG 24 hr tablet Take 1 tablet (25 mg total) by mouth daily.   Multiple Vitamin (MULTIVITAMIN) tablet Take 1 tablet by mouth. Alive Women's MVI-Take one daily   potassium chloride (KLOR-CON) 10 MEQ tablet Take 1 tablet (10 mEq total) by mouth daily.   rosuvastatin (CRESTOR) 40 MG tablet Take 1 tablet (40 mg total) by mouth daily.   valACYclovir (VALTREX) 500 MG tablet Take 1 tablet (500 mg total) by mouth 2 (two) times daily. With outbreak   No facility-administered encounter medications on file as of 12/18/2020.    Allergies (verified) Patient has no known allergies.   History: Past Medical History:  Diagnosis Date   Arthritis    in hands   Dyslipidemia    Hypertension    PID (pelvic inflammatory disease)    Tubal obstruction   STD (sexually transmitted disease)    HSV   Past  Surgical History:  Procedure Laterality Date   BREAST BIOPSY  1990   neg/   BREAST LUMPECTOMY WITH RADIOACTIVE SEED LOCALIZATION Left 08/20/2015   Procedure: BREAST LUMPECTOMY WITH RADIOACTIVE SEED LOCALIZATION;  Surgeon: Donnie Mesa, MD;  Location: [facility];  Service: General;  Laterality: Left;   DILATION AND CURETTAGE OF UTERUS  1979   PELVIC LAPAROSCOPY      DL tubal lavage   TONSILLECTOMY AND ADENOIDECTOMY     Family History  Problem Relation Age of Onset   Kidney cancer Father    Diabetes Mother    Hypertension Mother    Heart disease Brother    Social History   Socioeconomic History   Marital status: Married    Spouse name: Not on file   Number of children: 1   Years of education: Not on file   Highest education level: Not on file  Occupational History   Not on file  Tobacco Use   Smoking status: Never   Smokeless tobacco: Never  Vaping Use   Vaping Use: Never used  Substance and Sexual Activity   Alcohol use: Yes    Comment: Rare   Drug use: No   Sexual activity: Yes    Birth control/protection: Post-menopausal    Comment: 1st intercourse 50 yo-5 partners  Other Topics Concern   Not on file  Social History Narrative   Not on file   Social Determinants of Health   Financial Resource Strain: Low Risk    Difficulty of Paying Living Expenses: Not hard at all  Food Insecurity: No Food Insecurity   Worried About Charity fundraiser in the Last Year: Never true   Arboriculturist in the Last Year: Never true  Transportation Needs: No Transportation Needs   Lack of Transportation (Medical): No   Lack of Transportation (Non-Medical): No  Physical Activity: Sufficiently Active   Days of Exercise per Week: 5 days   Minutes of Exercise per Session: 30 min  Stress: No Stress Concern Present   Feeling of Stress : Not at all  Social Connections: Socially Integrated   Frequency of Communication with Friends and Family: More than three times a week   Frequency of Social Gatherings with Friends and Family: More than three times a week   Attends Religious Services: More than 4 times per year   Active Member of [employer] or Organizations: Yes   Attends Music therapist: More than 4 times per year   Marital Status: Married    Tobacco Counseling Counseling given: Not Answered   Clinical Intake:  Pre-visit  preparation completed: Yes  Pain : No/denies pain     Nutritional Risks: None Diabetes: No  How often do you need to have someone help you when you read instructions, pamphlets, or other written materials from your doctor or pharmacy?: 1 - Never What is the last grade level you completed in school?: Bachelor's Degree  Diabetic? no  Interpreter Needed?: No  Information entered by :: Lisette Abu, LPN   Activities of Daily Living In your present state of health, do you have any difficulty performing the following activities: 12/18/2020 10/13/2020  Hearing? N N  Vision? N N  Difficulty concentrating or making decisions? N N  Walking or climbing stairs? N N  Dressing or bathing? N N  Doing errands, shopping? N N  Preparing Food and eating ? N -  Using the Toilet? N -  In the past six months, have you accidently  leaked urine? N -  Do you have problems with loss of bowel control? N -  Managing your Medications? N -  Managing your Finances? N -  Housekeeping or managing your Housekeeping? N -  Some recent data might be hidden    Patient Care Team: Biagio Borg, MD as PCP - General  Indicate any recent Medical Services you may have received from other than [facility] providers in the past year (date may be approximate).     Assessment:   This is a routine wellness examination for Suheily.  Hearing/Vision screen Hearing Screening - Comments:: Patient denied any hearing difficulty. Vision Screening - Comments:: Annual eye exam done by [facility].   Dietary issues and exercise activities discussed: Current Exercise Habits: Home exercise routine;Structured exercise class, Type of exercise: walking;treadmill;stretching;strength training/weights;exercise ball;Other - see comments (Silver Sneakers Program), Time (Minutes): 30, Frequency (Times/Week): 5, Weekly Exercise (Minutes/Week): 150, Intensity: Moderate, Exercise limited by: None identified   Goals  Addressed               This Visit's Progress     Patient Stated (pt-stated)        My goal is to lose 8-10 pounds by being physically active in the gym.      Depression Screen PHQ 2/9 Scores 12/18/2020 10/13/2020 10/13/2020 12/28/2017 09/20/2016 06/08/2015 12/24/2014  PHQ - 2 Score 0 0 0 0 3 0 0  PHQ- 9 Score - - - - 5 - -    Fall Risk Fall Risk  12/18/2020 10/13/2020 10/13/2020 12/28/2017 09/20/2016  Falls in the past year? 0 0 0 No Yes  Number falls in past yr: 0 0 0 - 1  Injury with Fall? 0 0 0 - -  Risk for fall due to : No Fall Risks - - - -  Follow up Falls evaluation completed - - - -    FALL RISK PREVENTION PERTAINING TO THE HOME:  Any stairs in or around the home? No  If so, are there any without handrails? No  Home free of loose throw rugs in walkways, pet beds, electrical cords, etc? Yes  Adequate lighting in your home to reduce risk of falls? Yes   ASSISTIVE DEVICES UTILIZED TO PREVENT FALLS:  Life alert? No  Use of a cane, walker or w/c? No  Grab bars in the bathroom? No  Shower chair or bench in shower? No  Elevated toilet seat or a handicapped toilet? No   TIMED UP AND GO:  Was the test performed? No .  Length of time to ambulate 10 feet: n/a sec.   Gait steady and fast without use of assistive device  Cognitive Function: Normal cognitive status assessed by direct observation by this Nurse Health Advisor. No abnormalities found.          Immunizations Immunization History  Administered Date(s) Administered   H1N1 02/04/2008   Influenza Split 01/31/2012, 12/12/2012   Influenza Whole 02/23/2007   Influenza, High Dose Seasonal PF 12/28/2017   Influenza,inj,Quad PF,6+ Mos 12/24/2014, 01/08/2019   PFIZER(Purple Top)SARS-COV-2 Vaccination 04/26/2019, 05/17/2019, 03/09/2020   Pneumococcal Conjugate-13 11/07/2013   Pneumococcal Polysaccharide-23 12/24/2014   Tdap 07/06/2012    TDAP status: Up to date  Flu Vaccine status: Due, Education has been  provided regarding the importance of this vaccine. Advised may receive this vaccine at local pharmacy or Health Dept. Aware to provide a copy of the vaccination record if obtained from local pharmacy or Health Dept. Verbalized acceptance and understanding.  Pneumococcal vaccine status: Up to date  Covid-19 vaccine status: Completed vaccines  Qualifies for Shingles Vaccine? Yes   Zostavax completed No   Shingrix Completed?: No.    Education has been provided regarding the importance of this vaccine. Patient has been advised to call insurance company to determine out of pocket expense if they have not yet received this vaccine. Advised may also receive vaccine at local pharmacy or Health Dept. Verbalized acceptance and understanding.  Screening Tests Health Maintenance  Topic Date Due   COVID-19 Vaccine (4 - Booster for Pfizer series) 07/08/2020   INFLUENZA VACCINE  11/02/2020   Zoster Vaccines- Shingrix (1 of 2) 01/13/2021 (Originally 04/13/1998)   MAMMOGRAM  12/10/2021   TETANUS/TDAP  07/07/2022   COLONOSCOPY (Pts 45-91yr Insurance coverage will need to be confirmed)  09/01/2025   DEXA SCAN  Completed   Hepatitis C Screening  Completed   PNA vac Low Risk Adult  Completed   HPV VACCINES  Aged Out    Health Maintenance  Health Maintenance Due  Topic Date Due   COVID-19 Vaccine (4 - Booster for PAlamoseries) 07/08/2020   INFLUENZA VACCINE  11/02/2020    Colorectal cancer screening: Type of screening: Colonoscopy. Completed 09/02/2015. Repeat every 10 years  Mammogram status: Completed 12/11/2019. Repeat every year (scheduled for 12/22/2020)  Bone Density status: Completed 01/08/2019. Results reflect: Bone density results: NORMAL. Repeat every 5 years.  Lung Cancer Screening: (Low Dose CT Chest recommended if Age 85-80years, 30 pack-year currently smoking OR have quit w/in 15years.) does not qualify.   Lung Cancer Screening Referral: no  Additional Screening:  Hepatitis C  Screening: does qualify; Completed yes  Vision Screening: Recommended annual ophthalmology exams for early detection of glaucoma and other disorders of the eye. Is the patient up to date with their annual eye exam?  Yes  Who is the provider or what is the name of the office in which the patient attends annual eye exams? [facility] If pt is not established with a provider, would they like to be referred to a provider to establish care? No .   Dental Screening: Recommended annual dental exams for proper oral hygiene  Community Resource Referral / Chronic Care Management: CRR required this visit?  No   CCM required this visit?  No      Plan:     I have personally reviewed and noted the following in the patient's chart:   Medical and social history Use of alcohol, tobacco or illicit drugs  Current medications and supplements including opioid prescriptions.  Functional ability and status Nutritional status Physical activity Advanced directives List of other physicians Hospitalizations, surgeries, and ER visits in previous 12 months Vitals Screenings to include cognitive, depression, and falls Referrals and appointments  In addition, I have reviewed and discussed with patient certain preventive protocols, quality metrics, and best practice recommendations. A written personalized care plan for preventive services as well as general preventive health recommendations were provided to patient.     SSheral Flow LPN   [id number]  Nurse Notes:  Patient is cogitatively intact. There were no vitals filed for this visit. There is no height or weight on file to calculate BMI. Patient stated that she has no issues with gait or balance; does not use any assistive devices. Medications reviewed with patient; no opioid use noted. Hearing Screening - Comments:: Patient denied any hearing difficulty. Vision Screening - Comments:: [facility]

## 2020-12-18 NOTE — Patient Instructions (Signed)
Ms. Kelli Jones , Thank you for taking time to come for your Medicare Wellness Visit. I appreciate your ongoing commitment to your health goals. Please review the following plan we discussed and let me know if I can assist you in the future.   Screening recommendations/referrals: Colonoscopy: 09/02/2015; due every 10 years (due 09/01/2025) Mammogram: 12/11/2019; due every year (scheduled for 12/22/2020) Bone Density: 01/08/2019; due every 5 years (due 01/08/2024) Recommended yearly ophthalmology/optometry visit for glaucoma screening and checkup Recommended yearly dental visit for hygiene and checkup  Vaccinations: Influenza vaccine: due Fall 2022 Pneumococcal vaccine: 11/07/2013, 12/24/2014 Tdap vaccine: 07/06/2012; due every 10 years (due 07/07/2022) Shingles vaccine: no record   Covid-19: 04/26/2019, 05/17/2019, 03/09/2020  Advanced directives: Advance directive discussed with you today. Even though you declined this today please call our office should you change your mind and we can give you the proper paperwork for you to fill out.  Conditions/risks identified: Yes; Client understands the importance of follow-up with providers by attending scheduled visits and discussed goals to eat healthier, increase physical activity, exercise the brain, socialize more, get enough sleep and make time for laughter.  Next appointment: Please schedule your next Medicare Wellness Visit with your Nurse Health Advisor in 1 year by calling [phone].   Preventive Care 45 Years and Older, Female Preventive care refers to lifestyle choices and visits with your health care provider that can promote health and wellness. What does preventive care include? A yearly physical exam. This is also called an annual well check. Dental exams once or twice a year. Routine eye exams. Ask your health care provider how often you should have your eyes checked. Personal lifestyle choices, including: Daily care of your teeth and  gums. Regular physical activity. Eating a healthy diet. Avoiding tobacco and drug use. Limiting alcohol use. Practicing safe sex. Taking low-dose aspirin every day. Taking vitamin and mineral supplements as recommended by your health care provider. What happens during an annual well check? The services and screenings done by your health care provider during your annual well check will depend on your age, overall health, lifestyle risk factors, and family history of disease. Counseling  Your health care provider may ask you questions about your: Alcohol use. Tobacco use. Drug use. Emotional well-being. Home and relationship well-being. Sexual activity. Eating habits. History of falls. Memory and ability to understand (cognition). Work and work Statistician. Reproductive health. Screening  You may have the following tests or measurements: Height, weight, and BMI. Blood pressure. Lipid and cholesterol levels. These may be checked every 5 years, or more frequently if you are over 20 years old. Skin check. Lung cancer screening. You may have this screening every year starting at age 28 if you have a 30-pack-year history of smoking and currently smoke or have quit within the past 15 years. Fecal occult blood test (FOBT) of the stool. You may have this test every year starting at age 19. Flexible sigmoidoscopy or colonoscopy. You may have a sigmoidoscopy every 5 years or a colonoscopy every 10 years starting at age 9. Hepatitis C blood test. Hepatitis B blood test. Sexually transmitted disease (STD) testing. Diabetes screening. This is done by checking your blood sugar (glucose) after you have not eaten for a while (fasting). You may have this done every 1-3 years. Bone density scan. This is done to screen for osteoporosis. You may have this done starting at age 59. Mammogram. This may be done every 1-2 years. Talk to your health care provider about how often  you should have regular  mammograms. Talk with your health care provider about your test results, treatment options, and if necessary, the need for more tests. Vaccines  Your health care provider may recommend certain vaccines, such as: Influenza vaccine. This is recommended every year. Tetanus, diphtheria, and acellular pertussis (Tdap, Td) vaccine. You may need a Td booster every 10 years. Zoster vaccine. You may need this after age 94. Pneumococcal 13-valent conjugate (PCV13) vaccine. One dose is recommended after age 3. Pneumococcal polysaccharide (PPSV23) vaccine. One dose is recommended after age 101. Talk to your health care provider about which screenings and vaccines you need and how often you need them. This information is not intended to replace advice given to you by your health care provider. Make sure you discuss any questions you have with your health care provider. Document Released: 04/17/2015 Document Revised: 12/09/2015 Document Reviewed: 01/20/2015 Elsevier Interactive Patient Education  2017 [employer] Prevention in the Home Falls can cause injuries. They can happen to people of all ages. There are many things you can do to make your home safe and to help prevent falls. What can I do on the outside of my home? Regularly fix the edges of walkways and driveways and fix any cracks. Remove anything that might make you trip as you walk through a door, such as a raised step or threshold. Trim any bushes or trees on the path to your home. Use bright outdoor lighting. Clear any walking paths of anything that might make someone trip, such as rocks or tools. Regularly check to see if handrails are loose or broken. Make sure that both sides of any steps have handrails. Any raised decks and porches should have guardrails on the edges. Have any leaves, snow, or ice cleared regularly. Use sand or salt on walking paths during winter. Clean up any spills in your garage right away. This includes oil  or grease spills. What can I do in the bathroom? Use night lights. Install grab bars by the toilet and in the tub and shower. Do not use towel bars as grab bars. Use non-skid mats or decals in the tub or shower. If you need to sit down in the shower, use a plastic, non-slip stool. Keep the floor dry. Clean up any water that spills on the floor as soon as it happens. Remove soap buildup in the tub or shower regularly. Attach bath mats securely with double-sided non-slip rug tape. Do not have throw rugs and other things on the floor that can make you trip. What can I do in the bedroom? Use night lights. Make sure that you have a light by your bed that is easy to reach. Do not use any sheets or blankets that are too big for your bed. They should not hang down onto the floor. Have a firm chair that has side arms. You can use this for support while you get dressed. Do not have throw rugs and other things on the floor that can make you trip. What can I do in the kitchen? Clean up any spills right away. Avoid walking on wet floors. Keep items that you use a lot in easy-to-reach places. If you need to reach something above you, use a strong step stool that has a grab bar. Keep electrical cords out of the way. Do not use floor polish or wax that makes floors slippery. If you must use wax, use non-skid floor wax. Do not have throw rugs and other things on  the floor that can make you trip. What can I do with my stairs? Do not leave any items on the stairs. Make sure that there are handrails on both sides of the stairs and use them. Fix handrails that are broken or loose. Make sure that handrails are as long as the stairways. Check any carpeting to make sure that it is firmly attached to the stairs. Fix any carpet that is loose or worn. Avoid having throw rugs at the top or bottom of the stairs. If you do have throw rugs, attach them to the floor with carpet tape. Make sure that you have a light  switch at the top of the stairs and the bottom of the stairs. If you do not have them, ask someone to add them for you. What else can I do to help prevent falls? Wear shoes that: Do not have high heels. Have rubber bottoms. Are comfortable and fit you well. Are closed at the toe. Do not wear sandals. If you use a stepladder: Make sure that it is fully opened. Do not climb a closed stepladder. Make sure that both sides of the stepladder are locked into place. Ask someone to hold it for you, if possible. Clearly mark and make sure that you can see: Any grab bars or handrails. First and last steps. Where the edge of each step is. Use tools that help you move around (mobility aids) if they are needed. These include: Canes. Walkers. Scooters. Crutches. Turn on the lights when you go into a dark area. Replace any light bulbs as soon as they burn out. Set up your furniture so you have a clear path. Avoid moving your furniture around. If any of your floors are uneven, fix them. If there are any pets around you, be aware of where they are. Review your medicines with your doctor. Some medicines can make you feel dizzy. This can increase your chance of falling. Ask your doctor what other things that you can do to help prevent falls. This information is not intended to replace advice given to you by your health care provider. Make sure you discuss any questions you have with your health care provider. Document Released: 01/15/2009 Document Revised: 08/27/2015 Document Reviewed: 04/25/2014 Elsevier Interactive Patient Education  2017 [employer].

## 2020-12-22 DIAGNOSIS — Z1231 Encounter for screening mammogram for malignant neoplasm of breast: Secondary | ICD-10-CM | POA: Diagnosis not present

## 2021-02-08 ENCOUNTER — Ambulatory Visit: Payer: Medicare PPO | Admitting: Internal Medicine

## 2021-10-25 DIAGNOSIS — E785 Hyperlipidemia, unspecified: Secondary | ICD-10-CM | POA: Diagnosis not present

## 2021-10-25 DIAGNOSIS — I10 Essential (primary) hypertension: Secondary | ICD-10-CM | POA: Diagnosis not present

## 2021-10-25 DIAGNOSIS — M1990 Unspecified osteoarthritis, unspecified site: Secondary | ICD-10-CM | POA: Diagnosis not present

## 2021-10-25 DIAGNOSIS — Z8249 Family history of ischemic heart disease and other diseases of the circulatory system: Secondary | ICD-10-CM | POA: Diagnosis not present

## 2021-10-25 DIAGNOSIS — B009 Herpesviral infection, unspecified: Secondary | ICD-10-CM | POA: Diagnosis not present

## 2021-10-25 DIAGNOSIS — Z833 Family history of diabetes mellitus: Secondary | ICD-10-CM | POA: Diagnosis not present

## 2021-10-25 DIAGNOSIS — Z809 Family history of malignant neoplasm, unspecified: Secondary | ICD-10-CM | POA: Diagnosis not present

## 2021-10-25 DIAGNOSIS — Z791 Long term (current) use of non-steroidal anti-inflammatories (NSAID): Secondary | ICD-10-CM | POA: Diagnosis not present

## 2021-11-17 ENCOUNTER — Ambulatory Visit (INDEPENDENT_AMBULATORY_CARE_PROVIDER_SITE_OTHER): Payer: Medicare PPO | Admitting: Nurse Practitioner

## 2021-11-17 ENCOUNTER — Encounter: Payer: Self-pay | Admitting: Nurse Practitioner

## 2021-11-17 VITALS — BP 152/98 | HR 90 | Ht 64.75 in | Wt 164.0 lb

## 2021-11-17 DIAGNOSIS — Z9189 Other specified personal risk factors, not elsewhere classified: Secondary | ICD-10-CM | POA: Diagnosis not present

## 2021-11-17 DIAGNOSIS — B009 Herpesviral infection, unspecified: Secondary | ICD-10-CM

## 2021-11-17 DIAGNOSIS — I10 Essential (primary) hypertension: Secondary | ICD-10-CM

## 2021-11-17 DIAGNOSIS — Z780 Asymptomatic menopausal state: Secondary | ICD-10-CM | POA: Diagnosis not present

## 2021-11-17 DIAGNOSIS — Z01419 Encounter for gynecological examination (general) (routine) without abnormal findings: Secondary | ICD-10-CM

## 2021-11-17 NOTE — Progress Notes (Signed)
Kelli Jones [birth year] [id number]   History:  71 y.o. G1P1001 presents for breast and pelvic exam.  Postmenopausal - no HRT, no bleeding. Normal pap history. H/O HSV, takes Valtrex as needed, rare outbreaks.   Gynecologic History No LMP recorded. Patient is postmenopausal.   Contraception: post menopausal status Sexually active: Yes  Health Maintenance Last Pap: 07/22/2019. Results were: Normal Last mammogram: 12/22/2020. Results were: Normal Last colonoscopy: 09/02/2015. Results were: Normal, 10-year recall Last Dexa: 01/08/2019. Results were: Normal  Past medical history, past surgical history, family history and social history were all reviewed and documented in the EPIC chart. Married. Retired Pharmacist, hospital. 1 son, 3 grandchildren.   ROS:  A ROS was performed and pertinent positives and negatives are included.  Exam:  Vitals:   11/17/21 1137  BP: (!) 152/98  Pulse: 90  SpO2: 97%  Weight: 164 lb (74.4 kg)  Height: 5' 4.75" (1.645 m)    Body mass index is 27.5 kg/m.  General appearance:  Normal Thyroid:  Symmetrical, normal in size, without palpable masses or nodularity. Respiratory  Auscultation:  Clear without wheezing or rhonchi Cardiovascular  Auscultation:  Regular rate, without rubs, murmurs or gallops  Edema/varicosities:  Not grossly evident Abdominal  Soft,nontender, without masses, guarding or rebound.  Liver/spleen:  No organomegaly noted  Hernia:  None appreciated  Skin  Inspection:  Grossly normal Breasts: Examined lying and sitting.   Right: Without masses, retractions, nipple discharge or axillary adenopathy.   Left: Without masses, retractions, nipple discharge or axillary adenopathy. Genitourinary   Inguinal/mons:  Normal without inguinal adenopathy  External genitalia:  Normal appearing vulva with no masses, tenderness, or lesions  BUS/Urethra/Skene's glands:  Normal  Vagina:  Normal appearing with normal color and discharge, no lesions. Atrophic  changes  Cervix:  Normal appearing without discharge or lesions  Uterus:  Normal in size, shape and contour.  Midline and mobile, nontender  Adnexa/parametria:     Rt: Normal in size, without masses or tenderness.   Lt: Normal in size, without masses or tenderness.  Anus and perineum: Normal  Digital rectal exam: Normal sphincter tone without palpated masses or tenderness  Patient informed chaperone available to be present for breast and pelvic exam. Patient has requested no chaperone to be present. Patient has been advised what will be completed during breast and pelvic exam.   Assessment/Plan:  71 y.o. G1P1001 for breast and pelvic exam.   Encounter for breast and pelvic examination - Education provided on SBEs, importance of preventative screenings, current guidelines, high calcium diet, regular exercise, and multivitamin daily. Labs with PCP.   Postmenopausal - no HRT, no bleeding.   Elevated blood pressure reading in office with diagnosis of hypertension - BP 152/98 today. Reports she has not been taking blood pressure medications consistently. Discussed importance of taking as prescribed. Plans to follow up with PCP or establish with new one.   HSV (herpes simplex virus) infection - Rare outbreaks, Oral and topical antivirals as needed.   Screening for cervical cancer - Normal Pap history. Discussed the option to stop screenings. She would like to continue. Pap due 2024.   Screening for breast cancer - Normal mammogram history.  Continue annual screenings.  Normal breast exam today.  Screening for colon cancer - 2017 colonoscopy. Will repeat at 10-year interval per GI recommendation.   Screening for osteoporosis - Normal Dexa in 2020. Will plan to repeat in 2025. Continue Vitamin D/Calcium supplement and regular exercise.   Return in 1 year for  breast and pelvic exam.     Tamela Gammon DNP, 12:04 PM 11/17/2021

## 2021-11-18 ENCOUNTER — Encounter: Payer: Self-pay | Admitting: Internal Medicine

## 2021-11-18 ENCOUNTER — Ambulatory Visit: Payer: Medicare PPO | Admitting: Internal Medicine

## 2021-11-18 ENCOUNTER — Telehealth: Payer: Self-pay | Admitting: *Deleted

## 2021-11-18 VITALS — BP 178/96 | HR 71 | Temp 97.9°F | Ht 64.0 in | Wt 166.0 lb

## 2021-11-18 DIAGNOSIS — Z0001 Encounter for general adult medical examination with abnormal findings: Secondary | ICD-10-CM | POA: Diagnosis not present

## 2021-11-18 DIAGNOSIS — E559 Vitamin D deficiency, unspecified: Secondary | ICD-10-CM

## 2021-11-18 DIAGNOSIS — R413 Other amnesia: Secondary | ICD-10-CM | POA: Diagnosis not present

## 2021-11-18 DIAGNOSIS — I10 Essential (primary) hypertension: Secondary | ICD-10-CM

## 2021-11-18 DIAGNOSIS — E538 Deficiency of other specified B group vitamins: Secondary | ICD-10-CM | POA: Diagnosis not present

## 2021-11-18 DIAGNOSIS — E785 Hyperlipidemia, unspecified: Secondary | ICD-10-CM | POA: Diagnosis not present

## 2021-11-18 DIAGNOSIS — R7302 Impaired glucose tolerance (oral): Secondary | ICD-10-CM

## 2021-11-18 LAB — URINALYSIS, ROUTINE W REFLEX MICROSCOPIC
Bilirubin Urine: NEGATIVE
Hgb urine dipstick: NEGATIVE
Ketones, ur: NEGATIVE
Nitrite: NEGATIVE
Specific Gravity, Urine: 1.025 (ref 1.000–1.030)
Total Protein, Urine: NEGATIVE
Urine Glucose: NEGATIVE
Urobilinogen, UA: 0.2 (ref 0.0–1.0)
pH: 6 (ref 5.0–8.0)

## 2021-11-18 LAB — LIPID PANEL
Cholesterol: 215 mg/dL — ABNORMAL HIGH (ref 0–200)
HDL: 65.4 mg/dL (ref >39.00)
LDL Cholesterol: 139 mg/dL — ABNORMAL HIGH (ref 0–99)
NonHDL: 149.18
Total CHOL/HDL Ratio: 3
Triglycerides: 53 mg/dL (ref 0.0–149.0)
VLDL: 10.6 mg/dL (ref 0.0–40.0)

## 2021-11-18 LAB — BASIC METABOLIC PANEL
BUN: 11 mg/dL (ref 6–23)
CO2: 28 mEq/L (ref 19–32)
Calcium: 9.3 mg/dL (ref 8.4–10.5)
Chloride: 104 mEq/L (ref 96–112)
Creatinine, Ser: 0.84 mg/dL (ref 0.40–1.20)
GFR: 68.85 mL/min (ref >60.00)
Glucose, Bld: 86 mg/dL (ref 70–99)
Potassium: 3.8 mEq/L (ref 3.5–5.1)
Sodium: 140 mEq/L (ref 135–145)

## 2021-11-18 LAB — CBC WITH DIFFERENTIAL/PLATELET
Basophils Absolute: 0.1 10*3/uL (ref 0.0–0.1)
Basophils Relative: 0.8 % (ref 0.0–3.0)
Eosinophils Absolute: 0 10*3/uL (ref 0.0–0.7)
Eosinophils Relative: 0.7 % (ref 0.0–5.0)
HCT: 39.6 % (ref 36.0–46.0)
Hemoglobin: 12.9 g/dL (ref 12.0–15.0)
Lymphocytes Relative: 34.4 % (ref 12.0–46.0)
Lymphs Abs: 2.4 10*3/uL (ref 0.7–4.0)
MCHC: 32.5 g/dL (ref 30.0–36.0)
MCV: 87.5 fl (ref 78.0–100.0)
Monocytes Absolute: 0.5 10*3/uL (ref 0.1–1.0)
Monocytes Relative: 7.2 % (ref 3.0–12.0)
Neutro Abs: 4 10*3/uL (ref 1.4–7.7)
Neutrophils Relative %: 56.9 % (ref 43.0–77.0)
Platelets: 261 10*3/uL (ref 150.0–400.0)
RBC: 4.53 Mil/uL (ref 3.87–5.11)
RDW: 14.7 % (ref 11.5–15.5)
WBC: 7.1 10*3/uL (ref 4.0–10.5)

## 2021-11-18 LAB — TSH: TSH: 0.92 u[IU]/mL (ref 0.35–5.50)

## 2021-11-18 LAB — VITAMIN B12: Vitamin B-12: 951 pg/mL — ABNORMAL HIGH (ref 211–911)

## 2021-11-18 LAB — VITAMIN D 25 HYDROXY (VIT D DEFICIENCY, FRACTURES): VITD: 33.38 ng/mL (ref 30.00–100.00)

## 2021-11-18 LAB — HEPATIC FUNCTION PANEL
ALT: 10 U/L (ref 0–35)
AST: 19 U/L (ref 0–37)
Albumin: 4.1 g/dL (ref 3.5–5.2)
Alkaline Phosphatase: 71 U/L (ref 39–117)
Bilirubin, Direct: 0.1 mg/dL (ref 0.0–0.3)
Total Bilirubin: 0.4 mg/dL (ref 0.2–1.2)
Total Protein: 7.8 g/dL (ref 6.0–8.3)

## 2021-11-18 LAB — HEMOGLOBIN A1C: Hgb A1c MFr Bld: 6.3 % (ref 4.6–6.5)

## 2021-11-18 MED ORDER — ROSUVASTATIN CALCIUM 40 MG PO TABS
40.0000 mg | ORAL_TABLET | Freq: Every day | ORAL | 3 refills | Status: DC
Start: 2021-11-18 — End: 2024-01-22

## 2021-11-18 MED ORDER — LOSARTAN POTASSIUM-HCTZ 100-25 MG PO TABS
1.0000 | ORAL_TABLET | Freq: Every day | ORAL | 3 refills | Status: DC
Start: 2021-11-18 — End: 2024-01-22

## 2021-11-18 MED ORDER — AMLODIPINE BESYLATE 10 MG PO TABS: 10.0000 mg | ORAL_TABLET | Freq: Every day | ORAL | 3 refills | Status: DC

## 2021-11-18 MED ORDER — POTASSIUM CHLORIDE ER 10 MEQ PO TBCR
10.0000 meq | EXTENDED_RELEASE_TABLET | Freq: Every day | ORAL | 3 refills | Status: DC
Start: 2021-11-18 — End: 2024-01-22

## 2021-11-18 MED ORDER — METOPROLOL SUCCINATE ER 25 MG PO TB24
25.0000 mg | ORAL_TABLET | Freq: Every day | ORAL | 3 refills | Status: DC
Start: 2021-11-18 — End: 2024-01-22

## 2021-11-18 MED ORDER — VALACYCLOVIR HCL 500 MG PO TABS: 500.0000 mg | ORAL_TABLET | Freq: Two times a day (BID) | ORAL | 12 refills | Status: AC

## 2021-11-18 NOTE — Assessment & Plan Note (Signed)
Last vitamin D Lab Results  Component Value Date   VD25OH 33.38 11/18/2021   Low to start oral replacement

## 2021-11-18 NOTE — Patient Instructions (Addendum)
Please have your Shingrix (shingles) shots done at your local pharmacy, along with the new fall Flu shot  Please continue all other medications as before, and refills have been done if requested.  Please have the pharmacy call with any other refills you may need.  Please continue your efforts at being more active, low cholesterol diet, and weight control.  You are otherwise up to date with prevention measures today.  Please keep your appointments with your specialists as you may have planned  Please go to the LAB at the blood drawing area for the tests to be done  You will be contacted by phone if any changes need to be made immediately.  Otherwise, you will receive a letter about your results with an explanation, but please check with MyChart first.  Please remember to sign up for MyChart if you have not done so, as this will be important to you in the future with finding out test results, communicating by private email, and scheduling acute appointments online when needed.  Please make an Appointment to return in 6 months, or sooner if needed

## 2021-11-18 NOTE — Assessment & Plan Note (Signed)
BP Readings from Last 3 Encounters:  11/18/21 (!) 178/96  11/17/21 (!) 152/98  11/16/20 122/80   Severe uncontrolled, pt to restart medical treatment norvasc 10 qd, hyzaar 100-25 qd, toprol xl 25 qd

## 2021-11-18 NOTE — Progress Notes (Signed)
Patient ID: Kelli Jones, female   DOB: [birth year], 71 y.o.   MRN: [id number]         Chief Complaint:: wellness exam and blood pressure concerns , memory changes, lwo vit d, hld       HPI:  Kelli Jones is a 71 y.o. female here for wellness exam; declines covid booster, plans to have flu shot and shingrix at pharmacy, o/w up to date                        Also saw GYn yesterday with SBP in the 150s, so recommended here today.  Not taking BP med everyday per pt.  Has been out for several wks but willing to restart  Has been struggling with worsening memory changes in the past 6 mo, often forgetful.  Not taking Vit D.  Pt denies chest pain, increased sob or doe, wheezing, orthopnea, PND, increased LE swelling, palpitations, dizziness or syncope.   Pt denies polydipsia, polyuria, or new focal neuro s/s.    Pt denies fever, night sweats, loss of appetite, or other constitutional symptoms , though has lost several lbs overall in the past yr.     Wt Readings from Last 3 Encounters:  11/18/21 166 lb (75.3 kg)  11/17/21 164 lb (74.4 kg)  11/16/20 173 lb (78.5 kg)   BP Readings from Last 3 Encounters:  11/18/21 (!) 178/96  11/17/21 (!) 152/98  11/16/20 122/80   Immunization History  Administered Date(s) Administered   H1N1 02/04/2008   Influenza Split 01/31/2012, 12/12/2012   Influenza Whole 02/23/2007   Influenza, High Dose Seasonal PF 12/28/2017   Influenza,inj,Quad PF,6+ Mos 12/24/2014, 01/08/2019   PFIZER(Purple Top)SARS-COV-2 Vaccination 04/26/2019, 05/17/2019, 03/09/2020   Pneumococcal Conjugate-13 11/07/2013   Pneumococcal Polysaccharide-23 12/24/2014   Tdap 07/06/2012   There are no preventive care reminders to display for this patient.     Past Medical History:  Diagnosis Date   Arthritis    in hands   Dyslipidemia    Hypertension    PID (pelvic inflammatory disease)    Tubal obstruction   STD (sexually transmitted disease)    HSV   Past Surgical History:   Procedure Laterality Date   BREAST BIOPSY  1990   neg/   BREAST LUMPECTOMY WITH RADIOACTIVE SEED LOCALIZATION Left 08/20/2015   Procedure: BREAST LUMPECTOMY WITH RADIOACTIVE SEED LOCALIZATION;  Surgeon: Donnie Mesa, MD;  Location: [address];  Service: General;  Laterality: Left;   DILATION AND CURETTAGE OF UTERUS  1979   PELVIC LAPAROSCOPY     DL tubal lavage   TONSILLECTOMY AND ADENOIDECTOMY      reports that she has never smoked. She has never used smokeless tobacco. She reports current alcohol use. She reports that she does not use drugs. family history includes Diabetes in her mother; Heart disease in her brother; Hypertension in her mother; Kidney cancer in her father. No Known Allergies Current Outpatient Medications on File Prior to Visit  Medication Sig Dispense Refill   acyclovir ointment (ZOVIRAX) 5 % Apply 1 application topically every 3 (three) hours. 3 g 5   aspirin 81 MG EC tablet Take 1 tablet (81 mg total) by mouth daily. Swallow whole. 30 tablet 12   Biotin w/ Vitamins C & E (HAIR/SKIN/NAILS PO) Take by mouth as needed.     Calcium Carb-Cholecalciferol (CALCIUM + D3 PO) Take by mouth daily.     Cholecalciferol 50 MCG (2000 UT) TABS 1 tab  by mouth once daily 30 tablet 99   cyclobenzaprine (FLEXERIL) 5 MG tablet Take 1 tablet (5 mg total) by mouth 3 (three) times daily as needed for muscle spasms. 60 tablet 1   meloxicam (MOBIC) 15 MG tablet TAKE 1 TABLET(15 MG) BY MOUTH DAILY 30 tablet 5   Multiple Vitamin (MULTIVITAMIN) tablet Take 1 tablet by mouth. Alive Women's MVI-Take one daily     No current facility-administered medications on file prior to visit.        ROS:  All others reviewed and negative.  Objective        PE:  BP (!) 178/96 (BP Location: Right Arm, Patient Position: Sitting, Cuff Size: Large)   Pulse 71   Temp 97.9 F (36.6 C) (Oral)   Ht '5\' 4"'$  (1.626 m)   Wt 166 lb (75.3 kg)   SpO2 99%   BMI 28.49 kg/m                  Constitutional: Pt appears in NAD               HENT: Head: NCAT.                Right Ear: External ear normal.                 Left Ear: External ear normal.                Eyes: . Pupils are equal, round, and reactive to light. Conjunctivae and EOM are normal               Nose: without d/c or deformity               Neck: Neck supple. Gross normal ROM               Cardiovascular: Normal rate and regular rhythm.                 Pulmonary/Chest: Effort normal and breath sounds without rales or wheezing.                Abd:  Soft, NT, ND, + BS, no organomegaly               Neurological: Pt is alert. At baseline orientation, motor grossly intact               Skin: Skin is warm. No rashes, no other new lesions, LE edema - none               Psychiatric: Pt behavior is normal without agitation   Micro: none  Cardiac tracings I have personally interpreted today:  none  Pertinent Radiological findings (summarize): none   Lab Results  Component Value Date   WBC 7.1 11/18/2021   HGB 12.9 11/18/2021   HCT 39.6 11/18/2021   PLT 261.0 11/18/2021   GLUCOSE 86 11/18/2021   CHOL 215 (H) 11/18/2021   TRIG 53.0 11/18/2021   HDL 65.40 11/18/2021   LDLCALC 139 (H) 11/18/2021   ALT 10 11/18/2021   AST 19 11/18/2021   NA 140 11/18/2021   K 3.8 11/18/2021   CL 104 11/18/2021   CREATININE 0.84 11/18/2021   BUN 11 11/18/2021   CO2 28 11/18/2021   TSH 0.92 11/18/2021   HGBA1C 6.3 11/18/2021   Assessment/Plan:  Kelli Jones is a 71 y.o. Black or African American [2] female with  has a past medical history of Arthritis, Dyslipidemia, Hypertension, PID (pelvic  inflammatory disease), and STD (sexually transmitted disease).  Encounter for well adult exam with abnormal findings Age and sex appropriate education and counseling updated with regular exercise and diet Referrals for preventative services - none needed Immunizations addressed - pt for Tdap and shingirx at local pharmacy Smoking  counseling  - none needed Evidence for depression or other mood disorder - none significant Most recent labs reviewed. I have personally reviewed and have noted: 1) the patient's medical and social history 2) The patient's current medications and supplements 3) The patient's height, weight, and BMI have been recorded in the chart   Hypertension, uncontrolled BP Readings from Last 3 Encounters:  11/18/21 (!) 178/96  11/17/21 (!) 152/98  11/16/20 122/80   Severe uncontrolled, pt to restart medical treatment norvasc 10 qd, hyzaar 100-25 qd, toprol xl 25 qd   Dyslipidemia Lab Results  Component Value Date   LDLCALC 139 (H) 11/18/2021   Uncontrolled, pt to start crestor 40 mg qd   Impaired glucose tolerance Lab Results  Component Value Date   HGBA1C 6.3 11/18/2021   Stable, pt to continue current medical treatment  - diet, wt control   Vitamin D deficiency Last vitamin D Lab Results  Component Value Date   VD25OH 33.38 11/18/2021   Low to start oral replacement   Memory changes ? MCI vs forgetful - delcines brain MRI or neurology for now  Followup: Return in about 6 months (around 05/21/2022).  Cathlean Cower, MD 11/18/2021 10:08 PM [facility]

## 2021-11-18 NOTE — Assessment & Plan Note (Signed)
Age and sex appropriate education and counseling updated with regular exercise and diet Referrals for preventative services - none needed Immunizations addressed - pt for Tdap and shingirx at local pharmacy Smoking counseling  - none needed Evidence for depression or other mood disorder - none significant Most recent labs reviewed. I have personally reviewed and have noted: 1) the patient's medical and social history 2) The patient's current medications and supplements 3) The patient's height, weight, and BMI have been recorded in the chart

## 2021-11-18 NOTE — Assessment & Plan Note (Signed)
Lab Results  Component Value Date   LDLCALC 139 (H) 11/18/2021   Uncontrolled, pt to start crestor 40 mg qd

## 2021-11-18 NOTE — Telephone Encounter (Signed)
Patient was seen on 11/17/21 reports she bought a paper with blood pressure reads on it and showed you. Patient said she doesn't recall getting that paper back. Please advise

## 2021-11-18 NOTE — Assessment & Plan Note (Signed)
Lab Results  Component Value Date   HGBA1C 6.3 11/18/2021   Stable, pt to continue current medical treatment  - diet, wt control

## 2021-11-18 NOTE — Assessment & Plan Note (Signed)
?   MCI vs forgetful - delcines brain MRI or neurology for now

## 2021-11-21 NOTE — Telephone Encounter (Signed)
We discussed her blood pressure and that she had not been taking her medications consistently but I was never shown a paper with documented readings.

## 2021-11-22 NOTE — Telephone Encounter (Signed)
Spoke with patient and informed her. She said that she found the paper. She said to tell you she picked up her BP medication and has been taking correctly.

## 2021-12-20 ENCOUNTER — Ambulatory Visit (INDEPENDENT_AMBULATORY_CARE_PROVIDER_SITE_OTHER): Payer: Medicare PPO

## 2021-12-20 VITALS — Ht 64.0 in | Wt 167.0 lb

## 2021-12-20 DIAGNOSIS — Z0000 Encounter for general adult medical examination without abnormal findings: Secondary | ICD-10-CM | POA: Diagnosis not present

## 2021-12-20 NOTE — Progress Notes (Signed)
Virtual Visit via Telephone Note  I connected with  Kelli Jones on 12/20/21 at 10:00 AM EDT by telephone and verified that I am speaking with the correct person using two identifiers.  Location: Patient: Home Provider: [place] Persons participating in the virtual visit: [place]   I discussed the limitations, risks, security and privacy concerns of performing an evaluation and management service by telephone and the availability of in person appointments. The patient expressed understanding and agreed to proceed.  Interactive audio and video telecommunications were attempted between this nurse and patient, however failed, due to patient having technical difficulties OR patient did not have access to video capability.  We continued and completed visit with audio only.  Some vital signs may be absent or patient reported.   Sheral Flow, LPN  Subjective:   Kelli Jones is a 71 y.o. female who presents for Medicare Annual (Subsequent) preventive examination.  Review of Systems     Cardiac Risk Factors include: advanced age (>43mn, >>36women);dyslipidemia;family history of premature cardiovascular disease;hypertension     Objective:    Today's Vitals   12/20/21 1023  Weight: 167 lb (75.8 kg)  Height: '5\' 4"'$  (1.626 m)  PainSc: 0-No pain   Body mass index is 28.67 kg/m.     12/20/2021   10:22 AM 12/20/2021   10:04 AM 12/18/2020   10:04 AM 02/18/2019    2:52 PM 12/28/2017   10:32 AM 09/08/2016    9:47 PM 08/20/2015    9:46 AM  Advanced Directives  Does Patient Have a Medical Advance Directive? No No No No No No No  Would patient like information on creating a medical advance directive? Yes (MAU/Ambulatory/Procedural Areas - Information given) No - Patient declined No - Patient declined No - Patient declined Yes (ED - Information included in AVS)  No - patient declined information    Current Medications (verified) Outpatient Encounter  Medications as of 12/20/2021  Medication Sig   acyclovir ointment (ZOVIRAX) 5 % Apply 1 application topically every 3 (three) hours.   amLODipine (NORVASC) 10 MG tablet Take 1 tablet (10 mg total) by mouth daily.   aspirin 81 MG EC tablet Take 1 tablet (81 mg total) by mouth daily. Swallow whole.   Biotin w/ Vitamins C & E (HAIR/SKIN/NAILS PO) Take by mouth as needed.   Calcium Carb-Cholecalciferol (CALCIUM + D3 PO) Take by mouth daily.   Cholecalciferol 50 MCG (2000 UT) TABS 1 tab by mouth once daily   cyclobenzaprine (FLEXERIL) 5 MG tablet Take 1 tablet (5 mg total) by mouth 3 (three) times daily as needed for muscle spasms.   losartan-hydrochlorothiazide (HYZAAR) 100-25 MG tablet Take 1 tablet by mouth daily.   meloxicam (MOBIC) 15 MG tablet TAKE 1 TABLET(15 MG) BY MOUTH DAILY   metoprolol succinate (TOPROL-XL) 25 MG 24 hr tablet Take 1 tablet (25 mg total) by mouth daily.   Multiple Vitamin (MULTIVITAMIN) tablet Take 1 tablet by mouth. Alive Women's MVI-Take one daily   potassium chloride (KLOR-CON) 10 MEQ tablet Take 1 tablet (10 mEq total) by mouth daily.   rosuvastatin (CRESTOR) 40 MG tablet Take 1 tablet (40 mg total) by mouth daily.   valACYclovir (VALTREX) 500 MG tablet Take 1 tablet (500 mg total) by mouth 2 (two) times daily. With outbreak   No facility-administered encounter medications on file as of 12/20/2021.    Allergies (verified) Patient has no known allergies.   History: Past Medical History:  Diagnosis Date  Arthritis    in hands   Dyslipidemia    Hypertension    PID (pelvic inflammatory disease)    Tubal obstruction   STD (sexually transmitted disease)    HSV   Past Surgical History:  Procedure Laterality Date   BREAST BIOPSY  1990   neg/   BREAST LUMPECTOMY WITH RADIOACTIVE SEED LOCALIZATION Left 08/20/2015   Procedure: BREAST LUMPECTOMY WITH RADIOACTIVE SEED LOCALIZATION;  Surgeon: Donnie Mesa, MD;  Location: [facility];  Service:  General;  Laterality: Left;   DILATION AND CURETTAGE OF UTERUS  1979   PELVIC LAPAROSCOPY     DL tubal lavage   TONSILLECTOMY AND ADENOIDECTOMY     Family History  Problem Relation Age of Onset   Kidney cancer Father    Diabetes Mother    Hypertension Mother    Heart disease Brother    Social History   Socioeconomic History   Marital status: Married    Spouse name: Not on file   Number of children: 1   Years of education: Not on file   Highest education level: Not on file  Occupational History   Not on file  Tobacco Use   Smoking status: Never   Smokeless tobacco: Never  Vaping Use   Vaping Use: Never used  Substance and Sexual Activity   Alcohol use: Yes    Comment: Rare   Drug use: No   Sexual activity: Yes    Birth control/protection: Post-menopausal    Comment: 1st intercourse 51 yo-5 partners, HSV  Other Topics Concern   Not on file  Social History Narrative   Not on file   Social Determinants of Health   Financial Resource Strain: Low Risk  (12/20/2021)   Overall Financial Resource Strain (CARDIA)    Difficulty of Paying Living Expenses: Not hard at all  Food Insecurity: No Food Insecurity (12/20/2021)   Hunger Vital Sign    Worried About Running Out of Food in the Last Year: Never true    Ran Out of Food in the Last Year: Never true  Transportation Needs: No Transportation Needs (12/20/2021)   PRAPARE - Hydrologist (Medical): No    Lack of Transportation (Non-Medical): No  Physical Activity: Sufficiently Active (12/20/2021)   Exercise Vital Sign    Days of Exercise per Week: 3 days    Minutes of Exercise per Session: 60 min  Stress: No Stress Concern Present (12/20/2021)   Manuel Garcia    Feeling of Stress : Not at all  Social Connections: [place] (12/20/2021)   Social Connection and Isolation Panel [NHANES]    Frequency of Communication with Friends  and Family: More than three times a week    Frequency of Social Gatherings with Friends and Family: More than three times a week    Attends Religious Services: More than 4 times per year    Active Member of [employer] or Organizations: Yes    Attends Music therapist: More than 4 times per year    Marital Status: Married    Tobacco Counseling Counseling given: Not Answered   Clinical Intake:  Pre-visit preparation completed: Yes  Pain : No/denies pain Pain Score: 0-No pain     BMI - recorded: 28.49 Nutritional Risks: None Diabetes: No  How often do you need to have someone help you when you read instructions, pamphlets, or other written materials from your doctor or pharmacy?: 1 - Never  What is the last grade level you completed in school?: HSG; [employer]  Diabetic? no  Interpreter Needed?: No  Information entered by :: Ransom Nickson, LPN.   Activities of Daily Living    12/20/2021   10:11 AM  In your present state of health, do you have any difficulty performing the following activities:  Hearing? 0  Vision? 0  Difficulty concentrating or making decisions? 0  Walking or climbing stairs? 0  Dressing or bathing? 0  Doing errands, shopping? 0  Preparing Food and eating ? N  Using the Toilet? N  In the past six months, have you accidently leaked urine? N  Do you have problems with loss of bowel control? N  Managing your Medications? N  Managing your Finances? N  Housekeeping or managing your Housekeeping? N    Patient Care Team: Biagio Borg, MD as PCP - General Opthamology, [facility] as Consulting Physician (Ophthalmology) Tamela Gammon, NP as Nurse Practitioner (Gynecology)  Indicate any recent Medical Services you may have received from other than [facility] providers in the past year (date may be approximate).     Assessment:   This is a routine wellness examination for Kelli Jones.  Hearing/Vision screen Hearing Screening - Comments::  Denies hearing difficulties   Vision Screening - Comments:: Wears rx glasses - up to date with routine eye exams with [facility]   Dietary issues and exercise activities discussed: Current Exercise Habits: Home exercise routine, Time (Minutes): 60, Frequency (Times/Week): 3, Weekly Exercise (Minutes/Week): 180, Intensity: Moderate, Exercise limited by: None identified   Goals Addressed   None   Depression Screen    12/20/2021   10:08 AM 11/18/2021    8:58 AM 11/18/2021    8:46 AM 12/18/2020   10:20 AM 10/13/2020    1:59 PM 10/13/2020    1:27 PM 12/28/2017   10:39 AM  PHQ 2/9 Scores  PHQ - 2 Score 0 0  0 0 0 0  Exception Documentation   Patient refusal        Fall Risk    12/20/2021   10:06 AM 11/18/2021    8:58 AM 11/18/2021    8:46 AM 12/18/2020   10:23 AM 10/13/2020    1:59 PM  Fall Risk   Falls in the past year? 0 0 0 0 0  Number falls in past yr: 0 0 0 0 0  Injury with Fall? 0 0 0 0 0  Risk for fall due to : No Fall Risks  No Fall Risks No Fall Risks   Follow up Falls prevention discussed  Falls evaluation completed Falls evaluation completed     FALL RISK PREVENTION PERTAINING TO THE HOME:  Any stairs in or around the home? No  If so, are there any without handrails? No  Home free of loose throw rugs in walkways, pet beds, electrical cords, etc? Yes  Adequate lighting in your home to reduce risk of falls? Yes   ASSISTIVE DEVICES UTILIZED TO PREVENT FALLS:  Life alert? No  Use of a cane, walker or w/c? No  Grab bars in the bathroom? No  Shower chair or bench in shower? No  Elevated toilet seat or a handicapped toilet? No   TIMED UP AND GO:  Was the test performed? No . Phone Visit  Cognitive Function:        12/20/2021   10:12 AM  6CIT Screen  What Year? 0 points  What month? 0 points  What time? 0 points  Count  back from 20 0 points  Months in reverse 0 points  Repeat phrase 0 points  Total Score 0 points    Immunizations Immunization  History  Administered Date(s) Administered   H1N1 02/04/2008   Influenza Split 01/31/2012, 12/12/2012   Influenza Whole 02/23/2007   Influenza, High Dose Seasonal PF 12/28/2017   Influenza,inj,Quad PF,6+ Mos 12/24/2014, 01/08/2019   PFIZER(Purple Top)SARS-COV-2 Vaccination 04/26/2019, 05/17/2019, 03/09/2020   Pneumococcal Conjugate-13 11/07/2013   Pneumococcal Polysaccharide-23 12/24/2014   Tdap 07/06/2012    TDAP status: Up to date  Flu Vaccine status: Due, Education has been provided regarding the importance of this vaccine. Advised may receive this vaccine at local pharmacy or Health Dept. Aware to provide a copy of the vaccination record if obtained from local pharmacy or Health Dept. Verbalized acceptance and understanding.  Pneumococcal vaccine status: Up to date  Covid-19 vaccine status: Completed vaccines  Qualifies for Shingles Vaccine? Yes   Zostavax completed No   Shingrix Completed?: Yes  Screening Tests Health Maintenance  Topic Date Due   COVID-19 Vaccine (4 - Pfizer series) 05/04/2020   Zoster Vaccines- Shingrix (1 of 2) 02/18/2022 (Originally 04/13/1998)   INFLUENZA VACCINE  07/03/2022 (Originally 11/02/2021)   TETANUS/TDAP  07/07/2022   MAMMOGRAM  12/23/2022   COLONOSCOPY (Pts 45-27yr Insurance coverage will need to be confirmed)  09/01/2025   Pneumonia Vaccine 71 Years old  Completed   DEXA SCAN  Completed   Hepatitis C Screening  Completed   HPV VACCINES  Aged Out    Health Maintenance  Health Maintenance Due  Topic Date Due   COVID-19 Vaccine (4 - [place]) 05/04/2020    Colorectal cancer screening: Type of screening: Colonoscopy. Completed 09/02/2015. Repeat every 10 years  Mammogram status: Completed 12/22/2020. Repeat every year  Bone Density status: Completed 04/09/2018. Results reflect: Bone density results: NORMAL. Repeat every 5 years.  Lung Cancer Screening: (Low Dose CT Chest recommended if Age 938-80years, 30 pack-year currently  smoking OR have quit w/in 15years.) does not qualify.   Lung Cancer Screening Referral: no  Additional Screening:  Hepatitis C Screening: does qualify; Completed 06/18/2015  Vision Screening: Recommended annual ophthalmology exams for early detection of glaucoma and other disorders of the eye. Is the patient up to date with their annual eye exam?  Yes  Who is the provider or what is the name of the [facility] which the patient [facility] If pt is not established with a provider, would they like to be referred to a provider to establish care? No .   Dental Screening: Recommended annual dental exams for proper oral hygiene  Community Resource Referral / Chronic Care Management: CRR required this visit?  No   CCM required this visit?  No      Plan:     I have personally reviewed and noted the following in the patient's chart:   Medical and social history Use of alcohol, tobacco or illicit drugs  Current medications and supplements including opioid prescriptions. Patient is not currently taking opioid prescriptions. Functional ability and status Nutritional status Physical activity Advanced directives List of other physicians Hospitalizations, surgeries, and ER visits in previous 12 months Vitals Screenings to include cognitive, depression, and falls Referrals and appointments  In addition, I have reviewed and discussed with patient certain preventive protocols, quality metrics, and best practice recommendations. A written personalized care plan for preventive services as well as general preventive health recommendations were provided to patient.  Sheral Flow, LPN   2/33/4356   Nurse Notes:

## 2021-12-20 NOTE — Patient Instructions (Addendum)
Kelli Jones , Thank you for taking time to come for your Medicare Wellness Visit. I appreciate your ongoing commitment to your health goals. Please review the following plan we discussed and let me know if I can assist you in the future.   Screening recommendations/referrals: Colonoscopy: 09/02/2015; due every 10 years Mammogram: 12/22/2020; due every year Bone Density: 01/08/2019; due every 5 years Recommended yearly ophthalmology/optometry visit for glaucoma screening and checkup Recommended yearly dental visit for hygiene and checkup  Vaccinations: Influenza vaccine: due Fall Season 2023 Pneumococcal vaccine: 11/07/2013, 12/24/2014 Tdap vaccine: 07/06/2012; due every 10 years Shingles vaccine: no record on [place] Immunization Registry; please bring documentation   Covid-19:04/26/2019, 05/17/2019, 03/09/2020  Advanced directives: No; Advance directive discussed with you today. I have provided a copy for you to complete at home and have notarized. Once this is complete please bring a copy in to our office so we can scan it into your chart.  Conditions/risks identified: Yes  Next appointment: 12/22/2022 at 9:45 a.m. telephone visit with Mignon Pine, Nurse Health Advisor.  If you need to reschedule or cancel, please call [phone].  Preventive Care 73 Years and Older, Female Preventive care refers to lifestyle choices and visits with your health care provider that can promote health and wellness. What does preventive care include? A yearly physical exam. This is also called an annual well check. Dental exams once or twice a year. Routine eye exams. Ask your health care provider how often you should have your eyes checked. Personal lifestyle choices, including: Daily care of your teeth and gums. Regular physical activity. Eating a healthy diet. Avoiding tobacco and drug use. Limiting alcohol use. Practicing safe sex. Taking low-dose aspirin every day. Taking vitamin and mineral supplements as  recommended by your health care provider. What happens during an annual well check? The services and screenings done by your health care provider during your annual well check will depend on your age, overall health, lifestyle risk factors, and family history of disease. Counseling  Your health care provider may ask you questions about your: Alcohol use. Tobacco use. Drug use. Emotional well-being. Home and relationship well-being. Sexual activity. Eating habits. History of falls. Memory and ability to understand (cognition). Work and work Statistician. Reproductive health. Screening  You may have the following tests or measurements: Height, weight, and BMI. Blood pressure. Lipid and cholesterol levels. These may be checked every 5 years, or more frequently if you are over 2 years old. Skin check. Lung cancer screening. You may have this screening every year starting at age 73 if you have a 30-pack-year history of smoking and currently smoke or have quit within the past 15 years. Fecal occult blood test (FOBT) of the stool. You may have this test every year starting at age 73. Flexible sigmoidoscopy or colonoscopy. You may have a sigmoidoscopy every 5 years or a colonoscopy every 10 years starting at age 73. Hepatitis C blood test. Hepatitis B blood test. Sexually transmitted disease (STD) testing. Diabetes screening. This is done by checking your blood sugar (glucose) after you have not eaten for a while (fasting). You may have this done every 1-3 years. Bone density scan. This is done to screen for osteoporosis. You may have this done starting at age 73. Mammogram. This may be done every 1-2 years. Talk to your health care provider about how often you should have regular mammograms. Talk with your health care provider about your test results, treatment options, and if necessary, the need for more tests. Vaccines  Your health care provider may recommend certain vaccines, such  as: Influenza vaccine. This is recommended every year. Tetanus, diphtheria, and acellular pertussis (Tdap, Td) vaccine. You may need a Td booster every 10 years. Zoster vaccine. You may need this after age 73. Pneumococcal 13-valent conjugate (PCV13) vaccine. One dose is recommended after age 73. Pneumococcal polysaccharide (PPSV23) vaccine. One dose is recommended after age 70. Talk to your health care provider about which screenings and vaccines you need and how often you need them. This information is not intended to replace advice given to you by your health care provider. Make sure you discuss any questions you have with your health care provider. Document Released: 04/17/2015 Document Revised: 12/09/2015 Document Reviewed: 01/20/2015 Elsevier Interactive Patient Education  2017 [place] Prevention in the Home Falls can cause injuries. They can happen to people of all ages. There are many things you can do to make your home safe and to help prevent falls. What can I do on the outside of my home? Regularly fix the edges of walkways and driveways and fix any cracks. Remove anything that might make you trip as you walk through a door, such as a raised step or threshold. Trim any bushes or trees on the path to your home. Use bright outdoor lighting. Clear any walking paths of anything that might make someone trip, such as rocks or tools. Regularly check to see if handrails are loose or broken. Make sure that both sides of any steps have handrails. Any raised decks and porches should have guardrails on the edges. Have any leaves, snow, or ice cleared regularly. Use sand or salt on walking paths during winter. Clean up any spills in your garage right away. This includes oil or grease spills. What can I do in the bathroom? Use night lights. Install grab bars by the toilet and in the tub and shower. Do not use towel bars as grab bars. Use non-skid mats or decals in the tub or  shower. If you need to sit down in the shower, use a plastic, non-slip stool. Keep the floor dry. Clean up any water that spills on the floor as soon as it happens. Remove soap buildup in the tub or shower regularly. Attach bath mats securely with double-sided non-slip rug tape. Do not have throw rugs and other things on the floor that can make you trip. What can I do in the bedroom? Use night lights. Make sure that you have a light by your bed that is easy to reach. Do not use any sheets or blankets that are too big for your bed. They should not hang down onto the floor. Have a firm chair that has side arms. You can use this for support while you get dressed. Do not have throw rugs and other things on the floor that can make you trip. What can I do in the kitchen? Clean up any spills right away. Avoid walking on wet floors. Keep items that you use a lot in easy-to-reach places. If you need to reach something above you, use a strong step stool that has a grab bar. Keep electrical cords out of the way. Do not use floor polish or wax that makes floors slippery. If you must use wax, use non-skid floor wax. Do not have throw rugs and other things on the floor that can make you trip. What can I do with my stairs? Do not leave any items on the stairs. Make sure that there are  handrails on both sides of the stairs and use them. Fix handrails that are broken or loose. Make sure that handrails are as long as the stairways. Check any carpeting to make sure that it is firmly attached to the stairs. Fix any carpet that is loose or worn. Avoid having throw rugs at the top or bottom of the stairs. If you do have throw rugs, attach them to the floor with carpet tape. Make sure that you have a light switch at the top of the stairs and the bottom of the stairs. If you do not have them, ask someone to add them for you. What else can I do to help prevent falls? Wear shoes that: Do not have high heels. Have  rubber bottoms. Are comfortable and fit you well. Are closed at the toe. Do not wear sandals. If you use a stepladder: Make sure that it is fully opened. Do not climb a closed stepladder. Make sure that both sides of the stepladder are locked into place. Ask someone to hold it for you, if possible. Clearly mark and make sure that you can see: Any grab bars or handrails. First and last steps. Where the edge of each step is. Use tools that help you move around (mobility aids) if they are needed. These include: Canes. Walkers. Scooters. Crutches. Turn on the lights when you go into a dark area. Replace any light bulbs as soon as they burn out. Set up your furniture so you have a clear path. Avoid moving your furniture around. If any of your floors are uneven, fix them. If there are any pets around you, be aware of where they are. Review your medicines with your doctor. Some medicines can make you feel dizzy. This can increase your chance of falling. Ask your doctor what other things that you can do to help prevent falls. This information is not intended to replace advice given to you by your health care provider. Make sure you discuss any questions you have with your health care provider. Document Released: 01/15/2009 Document Revised: 08/27/2015 Document Reviewed: 04/25/2014 Elsevier Interactive Patient Education  2017 [employer].

## 2021-12-28 DIAGNOSIS — Z1231 Encounter for screening mammogram for malignant neoplasm of breast: Secondary | ICD-10-CM | POA: Diagnosis not present

## 2021-12-28 LAB — HM MAMMOGRAPHY

## 2022-05-23 ENCOUNTER — Ambulatory Visit: Payer: Medicare PPO | Admitting: Internal Medicine

## 2022-05-23 VITALS — BP 130/76 | HR 79 | Temp 97.5°F | Ht 64.0 in | Wt 163.0 lb

## 2022-05-23 DIAGNOSIS — E559 Vitamin D deficiency, unspecified: Secondary | ICD-10-CM

## 2022-05-23 DIAGNOSIS — Z0001 Encounter for general adult medical examination with abnormal findings: Secondary | ICD-10-CM

## 2022-05-23 DIAGNOSIS — R7302 Impaired glucose tolerance (oral): Secondary | ICD-10-CM | POA: Diagnosis not present

## 2022-05-23 DIAGNOSIS — I10 Essential (primary) hypertension: Secondary | ICD-10-CM | POA: Diagnosis not present

## 2022-05-23 DIAGNOSIS — E785 Hyperlipidemia, unspecified: Secondary | ICD-10-CM | POA: Diagnosis not present

## 2022-05-23 DIAGNOSIS — E538 Deficiency of other specified B group vitamins: Secondary | ICD-10-CM

## 2022-05-23 LAB — CBC WITH DIFFERENTIAL/PLATELET
Basophils Absolute: 0.1 10*3/uL (ref 0.0–0.1)
Basophils Relative: 0.8 % (ref 0.0–3.0)
Eosinophils Absolute: 0 10*3/uL (ref 0.0–0.7)
Eosinophils Relative: 0.5 % (ref 0.0–5.0)
HCT: 40.6 % (ref 36.0–46.0)
Hemoglobin: 13.5 g/dL (ref 12.0–15.0)
Lymphocytes Relative: 33 % (ref 12.0–46.0)
Lymphs Abs: 3 10*3/uL (ref 0.7–4.0)
MCHC: 33.2 g/dL (ref 30.0–36.0)
MCV: 87.6 fl (ref 78.0–100.0)
Monocytes Absolute: 0.7 10*3/uL (ref 0.1–1.0)
Monocytes Relative: 7.4 % (ref 3.0–12.0)
Neutro Abs: 5.2 10*3/uL (ref 1.4–7.7)
Neutrophils Relative %: 58.3 % (ref 43.0–77.0)
Platelets: 327 10*3/uL (ref 150.0–400.0)
RBC: 4.64 Mil/uL (ref 3.87–5.11)
RDW: 15.1 % (ref 11.5–15.5)
WBC: 9 10*3/uL (ref 4.0–10.5)

## 2022-05-23 LAB — TSH: TSH: 0.84 u[IU]/mL (ref 0.35–5.50)

## 2022-05-23 LAB — BASIC METABOLIC PANEL
BUN: 15 mg/dL (ref 6–23)
CO2: 29 mEq/L (ref 19–32)
Calcium: 10.1 mg/dL (ref 8.4–10.5)
Chloride: 103 mEq/L (ref 96–112)
Creatinine, Ser: 0.99 mg/dL (ref 0.40–1.20)
GFR: 56.33 mL/min — ABNORMAL LOW (ref >60.00)
Glucose, Bld: 93 mg/dL (ref 70–99)
Potassium: 3.7 mEq/L (ref 3.5–5.1)
Sodium: 139 mEq/L (ref 135–145)

## 2022-05-23 LAB — URINALYSIS, ROUTINE W REFLEX MICROSCOPIC
Bilirubin Urine: NEGATIVE
Hgb urine dipstick: NEGATIVE
Ketones, ur: NEGATIVE
Leukocytes,Ua: NEGATIVE
Nitrite: NEGATIVE
RBC / HPF: NONE SEEN (ref 0–?)
Specific Gravity, Urine: 1.015 (ref 1.000–1.030)
Total Protein, Urine: NEGATIVE
Urine Glucose: NEGATIVE
Urobilinogen, UA: 0.2 (ref 0.0–1.0)
pH: 7 (ref 5.0–8.0)

## 2022-05-23 LAB — HEPATIC FUNCTION PANEL
ALT: 13 U/L (ref 0–35)
AST: 19 U/L (ref 0–37)
Albumin: 4.3 g/dL (ref 3.5–5.2)
Alkaline Phosphatase: 65 U/L (ref 39–117)
Bilirubin, Direct: 0 mg/dL (ref 0.0–0.3)
Total Bilirubin: 0.4 mg/dL (ref 0.2–1.2)
Total Protein: 8.3 g/dL (ref 6.0–8.3)

## 2022-05-23 LAB — LIPID PANEL
Cholesterol: 207 mg/dL — ABNORMAL HIGH (ref 0–200)
HDL: 74.5 mg/dL (ref >39.00)
LDL Cholesterol: 121 mg/dL — ABNORMAL HIGH (ref 0–99)
NonHDL: 132.33
Total CHOL/HDL Ratio: 3
Triglycerides: 59 mg/dL (ref 0.0–149.0)
VLDL: 11.8 mg/dL (ref 0.0–40.0)

## 2022-05-23 LAB — VITAMIN D 25 HYDROXY (VIT D DEFICIENCY, FRACTURES): VITD: 51.43 ng/mL (ref 30.00–100.00)

## 2022-05-23 LAB — MICROALBUMIN / CREATININE URINE RATIO
Creatinine,U: 36.2 mg/dL
Microalb Creat Ratio: 1.9 mg/g (ref 0.0–30.0)
Microalb, Ur: <0.7 mg/dL (ref 0.0–1.9)

## 2022-05-23 LAB — VITAMIN B12: Vitamin B-12: 766 pg/mL (ref 211–911)

## 2022-05-23 LAB — HEMOGLOBIN A1C: Hgb A1c MFr Bld: 6.1 % (ref 4.6–6.5)

## 2022-05-23 NOTE — Patient Instructions (Addendum)
Please have your Shingrix (shingles) shots done at your local pharmacy.  Please continue all other medications as before, and refills have been done if requested.  Please have the pharmacy call with any other refills you may need.  Please continue your efforts at being more active, low cholesterol diet, and weight control.  You are otherwise up to date with prevention measures today.  Please keep your appointments with your specialists as you may have planned  Please go to the LAB at the blood drawing area for the tests to be done  You will be contacted by phone if any changes need to be made immediately.  Otherwise, you will receive a letter about your results with an explanation, but please check with MyChart first.  Please remember to sign up for MyChart if you have not done so, as this will be important to you in the future with finding out test results, communicating by private email, and scheduling acute appointments online when needed.  Please make an Appointment to return in 6 months, or sooner if needed

## 2022-05-23 NOTE — Progress Notes (Signed)
Patient ID: Kelli Jones, female   DOB: 05-Oct-1950, 72 y.o.   MRN: [id number]         Chief Complaint:: wellness exam and hld, hyperglycemia, htn, low vit d       HPI:  Kelli Jones is a 72 y.o. female here for wellness exam; for shingrix at pharmacy, declines covid booster, o/w up to date                        Also has not been taking statin recently.  Pt denies chest pain, increased sob or doe, wheezing, orthopnea, PND, increased LE swelling, palpitations, dizziness or syncope.   Pt denies polydipsia, polyuria, or new focal neuro s/s.    Pt denies fever, wt loss, night sweats, loss of appetite, or other constitutional symptoms  Lost several lbs with exercise.   Wt Readings from Last 3 Encounters:  05/23/22 163 lb (73.9 kg)  12/20/21 167 lb (75.8 kg)  11/18/21 166 lb (75.3 kg)   BP Readings from Last 3 Encounters:  05/23/22 130/76  11/18/21 (!) 178/96  11/17/21 (!) 152/98   Immunization History  Administered Date(s) Administered   H1N1 02/04/2008   Influenza Split 01/31/2012, 12/12/2012   Influenza Whole 02/23/2007   Influenza, High Dose Seasonal PF 12/28/2017   Influenza,inj,Quad PF,6+ Mos 12/24/2014, 01/08/2019   Influenza-Unspecified 01/17/2022   PFIZER(Purple Top)SARS-COV-2 Vaccination 04/26/2019, 05/17/2019, 03/09/2020   Pneumococcal Conjugate-13 11/07/2013   Pneumococcal Polysaccharide-23 12/24/2014   Respiratory Syncytial Virus Vaccine,Recomb Aduvanted(Arexvy) 01/17/2022   Tdap 07/06/2012   Health Maintenance Due  Topic Date Due   Zoster Vaccines- Shingrix (1 of 2) Never done      Past Medical History:  Diagnosis Date   Arthritis    in hands   Dyslipidemia    Hypertension    PID (pelvic inflammatory disease)    Tubal obstruction   STD (sexually transmitted disease)    HSV   Past Surgical History:  Procedure Laterality Date   BREAST BIOPSY  1990   neg/   BREAST LUMPECTOMY WITH RADIOACTIVE SEED LOCALIZATION Left 08/20/2015   Procedure: BREAST LUMPECTOMY  WITH RADIOACTIVE SEED LOCALIZATION;  Surgeon: Donnie Mesa, MD;  Location: [facility];  Service: General;  Laterality: Left;   DILATION AND CURETTAGE OF UTERUS  1979   PELVIC LAPAROSCOPY     DL tubal lavage   TONSILLECTOMY AND ADENOIDECTOMY      reports that she has never smoked. She has never used smokeless tobacco. She reports current alcohol use. She reports that she does not use drugs. family history includes Diabetes in her mother; Heart disease in her brother; Hypertension in her mother; Kidney cancer in her father. No Known Allergies Current Outpatient Medications on File Prior to Visit  Medication Sig Dispense Refill   acyclovir ointment (ZOVIRAX) 5 % Apply 1 application topically every 3 (three) hours. 3 g 5   amLODipine (NORVASC) 10 MG tablet Take 1 tablet (10 mg total) by mouth daily. 90 tablet 3   aspirin 81 MG EC tablet Take 1 tablet (81 mg total) by mouth daily. Swallow whole. 30 tablet 12   Biotin w/ Vitamins C & E (HAIR/SKIN/NAILS PO) Take by mouth as needed.     Calcium Carb-Cholecalciferol (CALCIUM + D3 PO) Take by mouth daily.     Cholecalciferol 50 MCG (2000 UT) TABS 1 tab by mouth once daily 30 tablet 99   cyclobenzaprine (FLEXERIL) 5 MG tablet Take 1 tablet (5 mg total) by  mouth 3 (three) times daily as needed for muscle spasms. 60 tablet 1   losartan-hydrochlorothiazide (HYZAAR) 100-25 MG tablet Take 1 tablet by mouth daily. 90 tablet 3   meloxicam (MOBIC) 15 MG tablet TAKE 1 TABLET(15 MG) BY MOUTH DAILY 30 tablet 5   metoprolol succinate (TOPROL-XL) 25 MG 24 hr tablet Take 1 tablet (25 mg total) by mouth daily. 90 tablet 3   Multiple Vitamin (MULTIVITAMIN) tablet Take 1 tablet by mouth. Alive Women's MVI-Take one daily     potassium chloride (KLOR-CON) 10 MEQ tablet Take 1 tablet (10 mEq total) by mouth daily. 90 tablet 3   rosuvastatin (CRESTOR) 40 MG tablet Take 1 tablet (40 mg total) by mouth daily. 90 tablet 3   valACYclovir (VALTREX) 500 MG tablet  Take 1 tablet (500 mg total) by mouth 2 (two) times daily. With outbreak 60 tablet 12   No current facility-administered medications on file prior to visit.        ROS:  All others reviewed and negative.  Objective        PE:  BP 130/76 (BP Location: Left Arm, Patient Position: Sitting, Cuff Size: Large)   Pulse 79   Temp (!) 97.5 F (36.4 C) (Oral)   Ht '5\' 4"'$  (1.626 m)   Wt 163 lb (73.9 kg)   SpO2 98%   BMI 27.98 kg/m                 Constitutional: Pt appears in NAD               HENT: Head: NCAT.                Right Ear: External ear normal.                 Left Ear: External ear normal.                Eyes: . Pupils are equal, round, and reactive to light. Conjunctivae and EOM are normal               Nose: without d/c or deformity               Neck: Neck supple. Gross normal ROM               Cardiovascular: Normal rate and regular rhythm.                 Pulmonary/Chest: Effort normal and breath sounds without rales or wheezing.                Abd:  Soft, NT, ND, + BS, no organomegaly               Neurological: Pt is alert. At baseline orientation, motor grossly intact               Skin: Skin is warm. No rashes, no other new lesions, LE edema - none               Psychiatric: Pt behavior is normal without agitation   Micro: none  Cardiac tracings I have personally interpreted today:  none  Pertinent Radiological findings (summarize): none   Lab Results  Component Value Date   WBC 9.0 05/23/2022   HGB 13.5 05/23/2022   HCT 40.6 05/23/2022   PLT 327.0 05/23/2022   GLUCOSE 93 05/23/2022   CHOL 207 (H) 05/23/2022   TRIG 59.0 05/23/2022   HDL 74.50 05/23/2022   LDLCALC 121 (H) 05/23/2022  ALT 13 05/23/2022   AST 19 05/23/2022   NA 139 05/23/2022   K 3.7 05/23/2022   CL 103 05/23/2022   CREATININE 0.99 05/23/2022   BUN 15 05/23/2022   CO2 29 05/23/2022   TSH 0.84 05/23/2022   HGBA1C 6.1 05/23/2022   MICROALBUR <0.7 05/23/2022   Assessment/Plan:   Kelli Jones is a 72 y.o. Black or African American [2] female with  has a past medical history of Arthritis, Dyslipidemia, Hypertension, PID (pelvic inflammatory disease), and STD (sexually transmitted disease).  Dyslipidemia Lab Results  Component Value Date   LDLCALC 139 (H) 11/18/2021   Uncontrolled, has been working on low chol diet, pt to restart crestor 40 gm and f/u lab today   Hypertension, uncontrolled BP Readings from Last 3 Encounters:  05/23/22 130/76  11/18/21 (!) 178/96  11/17/21 (!) 152/98   Stable, pt to continue medical treatment norvasc 10 mg qd, hyzaar 100- 25 mg qd, toprol xl 25 mg qd   Impaired glucose tolerance Lab Results  Component Value Date   HGBA1C 6.1 05/23/2022   Stable, pt to continue current medical treatment  - diet, wt control, excercsie   Vitamin D deficiency Last vitamin D Lab Results  Component Value Date   VD25OH 51.43 05/23/2022   Stable, cont oral replacement   Encounter for well adult exam with abnormal findings Age and sex appropriate education and counseling updated with regular exercise and diet Referrals for preventative services - none needed Immunizations addressed - nfor shingrix at pharmacy, declines covid booster Smoking counseling  - none needed Evidence for depression or other mood disorder - none significant Most recent labs reviewed. I have personally reviewed and have noted: 1) the patient's medical and social history 2) The patient's current medications and supplements 3) The patient's height, weight, and BMI have been recorded in the chart  Followup: Return in about 6 months (around 11/21/2022).  Cathlean Cower, MD 05/28/2022 1:14 PM [facility]

## 2022-05-23 NOTE — Assessment & Plan Note (Addendum)
Lab Results  Component Value Date   LDLCALC 139 (H) 11/18/2021   Uncontrolled, has been working on low chol diet, pt to restart crestor 40 gm and f/u lab today

## 2022-05-28 ENCOUNTER — Encounter: Payer: Self-pay | Admitting: Internal Medicine

## 2022-05-28 NOTE — Assessment & Plan Note (Signed)
Lab Results  Component Value Date   HGBA1C 6.1 05/23/2022   Stable, pt to continue current medical treatment  - diet, wt control, excercsie

## 2022-05-28 NOTE — Assessment & Plan Note (Signed)
Last vitamin D Lab Results  Component Value Date   VD25OH 51.43 05/23/2022   Stable, cont oral replacement

## 2022-05-28 NOTE — Assessment & Plan Note (Signed)
BP Readings from Last 3 Encounters:  05/23/22 130/76  11/18/21 (!) 178/96  11/17/21 (!) 152/98   Stable, pt to continue medical treatment norvasc 10 mg qd, hyzaar 100- 25 mg qd, toprol xl 25 mg qd

## 2022-05-28 NOTE — Assessment & Plan Note (Signed)
Age and sex appropriate education and counseling updated with regular exercise and diet Referrals for preventative services - none needed Immunizations addressed - nfor shingrix at pharmacy, declines covid booster Smoking counseling  - none needed Evidence for depression or other mood disorder - none significant Most recent labs reviewed. I have personally reviewed and have noted: 1) the patient's medical and social history 2) The patient's current medications and supplements 3) The patient's height, weight, and BMI have been recorded in the chart

## 2022-11-17 ENCOUNTER — Encounter (INDEPENDENT_AMBULATORY_CARE_PROVIDER_SITE_OTHER): Payer: Self-pay

## 2022-12-22 ENCOUNTER — Ambulatory Visit (INDEPENDENT_AMBULATORY_CARE_PROVIDER_SITE_OTHER): Payer: Medicare PPO

## 2022-12-22 VITALS — Ht 64.0 in | Wt 162.0 lb

## 2022-12-22 DIAGNOSIS — Z0000 Encounter for general adult medical examination without abnormal findings: Secondary | ICD-10-CM | POA: Diagnosis not present

## 2022-12-22 NOTE — Patient Instructions (Signed)
Kelli Jones , Thank you for taking time to come for your Medicare Wellness Visit. I appreciate your ongoing commitment to your health goals. Please review the following plan we discussed and let me know if I can assist you in the future.   Referrals/Orders/Follow-Ups/Clinician Recommendations: No  This is a list of the screening recommended for you and due dates:  Health Maintenance  Topic Date Due   Zoster (Shingles) Vaccine (1 of 2) Never done   DTaP/Tdap/Td vaccine (2 - Td or Tdap) 07/07/2022   Flu Shot  11/03/2022   COVID-19 Vaccine (5 - 2023-24 season) 12/04/2022   Medicare Annual Wellness Visit  12/22/2023   Mammogram  12/29/2023   Colon Cancer Screening  09/01/2025   Pneumonia Vaccine  Completed   DEXA scan (bone density measurement)  Completed   Hepatitis C Screening  Completed   HPV Vaccine  Aged Out    Advanced directives: (Copy Requested) Please bring a copy of your health care power of attorney and living will to the office to be added to your chart at your convenience.  Next Medicare Annual Wellness Visit scheduled for next year: Yes  *Insert Preventive Care attachment *Insert FALL PREVENTION attachment

## 2022-12-22 NOTE — Progress Notes (Signed)
Subjective:   Kelli Jones is a 72 y.o. female who presents for Medicare Annual (Subsequent) preventive examination.  Visit Complete: Virtual  I connected with  Kelli Jones on 12/22/22 by a audio enabled telemedicine application and verified that I am speaking with the correct person using two identifiers.  Patient Location: Home  Provider Location: Office/Clinic  I discussed the limitations of evaluation and management by telemedicine. The patient expressed understanding and agreed to proceed.  Patient provided his/her weight during this virtual phone visit.  Vital Signs: Because this visit was a virtual/telehealth visit, some criteria may be missing or patient reported. Any vitals not documented were not able to be obtained and vitals that have been documented are patient reported.    Cardiac Risk Factors include: advanced age (>7men, >23 women);dyslipidemia;family history of premature cardiovascular disease;hypertension     Objective:    Today's Vitals   12/22/22 0948  Weight: 162 lb (73.5 kg)  Height: 5\' 4"  (1.626 m)  PainSc: 0-No pain   Body mass index is 27.81 kg/m.     12/22/2022    9:49 AM 12/20/2021   10:22 AM 12/20/2021   10:04 AM 12/18/2020   10:04 AM 02/18/2019    2:52 PM 12/28/2017   10:32 AM 09/08/2016    9:47 PM  Advanced Directives  Does Patient Have a Medical Advance Directive? Yes No No No No No No  Type of Estate agent of [place];Living will        Does patient want to make changes to medical advance directive? --        Copy of Healthcare Power of Attorney in Chart? No - copy requested        Would patient like information on creating a medical advance directive?  Yes (MAU/Ambulatory/Procedural Areas - Information given) No - Patient declined No - Patient declined No - Patient declined Yes (ED - Information included in AVS)     Current Medications (verified) Outpatient Encounter Medications as of 12/22/2022  Medication Sig    acyclovir ointment (ZOVIRAX) 5 % Apply 1 application topically every 3 (three) hours.   amLODipine (NORVASC) 10 MG tablet Take 1 tablet (10 mg total) by mouth daily.   aspirin 81 MG EC tablet Take 1 tablet (81 mg total) by mouth daily. Swallow whole.   Biotin w/ Vitamins C & E (HAIR/SKIN/NAILS PO) Take by mouth as needed.   Calcium Carb-Cholecalciferol (CALCIUM + D3 PO) Take by mouth daily.   Cholecalciferol 50 MCG (2000 UT) TABS 1 tab by mouth once daily   cyclobenzaprine (FLEXERIL) 5 MG tablet Take 1 tablet (5 mg total) by mouth 3 (three) times daily as needed for muscle spasms.   losartan-hydrochlorothiazide (HYZAAR) 100-25 MG tablet Take 1 tablet by mouth daily.   meloxicam (MOBIC) 15 MG tablet TAKE 1 TABLET(15 MG) BY MOUTH DAILY   metoprolol succinate (TOPROL-XL) 25 MG 24 hr tablet Take 1 tablet (25 mg total) by mouth daily.   Multiple Vitamin (MULTIVITAMIN) tablet Take 1 tablet by mouth. Alive Women's MVI-Take one daily   potassium chloride (KLOR-CON) 10 MEQ tablet Take 1 tablet (10 mEq total) by mouth daily.   rosuvastatin (CRESTOR) 40 MG tablet Take 1 tablet (40 mg total) by mouth daily.   valACYclovir (VALTREX) 500 MG tablet Take 1 tablet (500 mg total) by mouth 2 (two) times daily. With outbreak   No facility-administered encounter medications on file as of 12/22/2022.    Allergies (verified) Patient has no known allergies.  History: Past Medical History:  Diagnosis Date   Arthritis    in hands   Dyslipidemia    Hypertension    PID (pelvic inflammatory disease)    Tubal obstruction   STD (sexually transmitted disease)    HSV   Past Surgical History:  Procedure Laterality Date   BREAST BIOPSY  1990   neg/   BREAST LUMPECTOMY WITH RADIOACTIVE SEED LOCALIZATION Left 08/20/2015   Procedure: BREAST LUMPECTOMY WITH RADIOACTIVE SEED LOCALIZATION;  Surgeon: Manus Rudd, MD;  Location: [facility];  Service: General;  Laterality: Left;   DILATION AND  CURETTAGE OF UTERUS  1979   PELVIC LAPAROSCOPY     DL tubal lavage   TONSILLECTOMY AND ADENOIDECTOMY     Family History  Problem Relation Age of Onset   Kidney cancer Father    Diabetes Mother    Hypertension Mother    Heart disease Brother    Social History   Socioeconomic History   Marital status: Married    Spouse name: Not on file   Number of children: 1   Years of education: Not on file   Highest education level: Not on file  Occupational History   Not on file  Tobacco Use   Smoking status: Never   Smokeless tobacco: Never  Vaping Use   Vaping status: Never Used  Substance and Sexual Activity   Alcohol use: Yes    Comment: Rare   Drug use: No   Sexual activity: Yes    Birth control/protection: Post-menopausal    Comment: 1st intercourse 72 yo-5 partners, HSV  Other Topics Concern   Not on file  Social History Narrative   Not on file   Social Determinants of Health   Financial Resource Strain: Low Risk  (12/22/2022)   Overall Financial Resource Strain (CARDIA)    Difficulty of Paying Living Expenses: Not hard at all  Food Insecurity: No Food Insecurity (12/22/2022)   Hunger Vital Sign    Worried About Running Out of Food in the Last Year: Never true    Ran Out of Food in the Last Year: Never true  Transportation Needs: No Transportation Needs (12/22/2022)   PRAPARE - Administrator, Civil Service (Medical): No    Lack of Transportation (Non-Medical): No  Physical Activity: Sufficiently Active (12/22/2022)   Exercise Vital Sign    Days of Exercise per Week: 3 days    Minutes of Exercise per Session: 60 min  Stress: No Stress Concern Present (12/22/2022)   [employer] of Occupational Health - Occupational Stress Questionnaire    Feeling of Stress : Not at all  Social Connections: Socially Integrated (12/22/2022)   Social Connection and Isolation Panel [NHANES]    Frequency of Communication with Friends and Family: More than three times a week     Frequency of Social Gatherings with Friends and Family: More than three times a week    Attends Religious Services: More than 4 times per year    Active Member of [employer] or Organizations: Yes    Attends Engineer, structural: More than 4 times per year    Marital Status: Married    Tobacco Counseling Counseling given: Not Answered   Clinical Intake:  Pre-visit preparation completed: Yes  Pain : No/denies pain Pain Score: 0-No pain     BMI - recorded: 27.81 Nutritional Status: BMI 25 -29 Overweight Nutritional Risks: None Diabetes: No  How often do you need to have someone help you when you  read instructions, pamphlets, or other written materials from your doctor or pharmacy?: 1 - Never What is the last grade level you completed in school?: COLLEGE GRADUATE  Interpreter Needed?: No  Information entered by :: Keymari Sato N. Henny Strauch, LPN.   Activities of Daily Living    12/22/2022    9:52 AM  In your present state of health, do you have any difficulty performing the following activities:  Hearing? 0  Vision? 0  Difficulty concentrating or making decisions? 0  Walking or climbing stairs? 0  Dressing or bathing? 0  Doing errands, shopping? 0  Preparing Food and eating ? N  Using the Toilet? N  In the past six months, have you accidently leaked urine? N  Do you have problems with loss of bowel control? N  Managing your Medications? N  Managing your Finances? N  Housekeeping or managing your Housekeeping? N    Patient Care Team: Corwin Levins, MD as PCP - Claire Shown, [facility] as Consulting Physician (Ophthalmology) Olivia Mackie, NP as Nurse Practitioner (Gynecology)  Indicate any recent Medical Services you may have received from other than [facility] providers in the past year (date may be approximate).     Assessment:   This is a routine wellness examination for Kelli Jones.  Hearing/Vision screen Hearing Screening - Comments::  Patient denied any hearing difficulty.   No hearing aids.  Vision Screening - Comments:: Patient does wear corrective lenses/contacts.  Annual eye exam done by: [facility]    Goals Addressed   None   Depression Screen    12/22/2022    9:51 AM 05/23/2022    8:38 AM 12/20/2021   10:08 AM 11/18/2021    8:58 AM 11/18/2021    8:46 AM 12/18/2020   10:20 AM 10/13/2020    1:59 PM  PHQ 2/9 Scores  PHQ - 2 Score 0 0 0 0  0 0  PHQ- 9 Score 0 0       Exception Documentation     Patient refusal      Fall Risk    12/22/2022    9:51 AM 05/23/2022    8:38 AM 12/20/2021   10:06 AM 11/18/2021    8:58 AM 11/18/2021    8:46 AM  Fall Risk   Falls in the past year? 0 0 0 0 0  Number falls in past yr: 0 0 0 0 0  Injury with Fall? 0 0 0 0 0  Risk for fall due to : No Fall Risks No Fall Risks No Fall Risks  No Fall Risks  Follow up Falls prevention discussed Falls evaluation completed Falls prevention discussed  Falls evaluation completed    MEDICARE RISK AT HOME: Medicare Risk at Home Any stairs in or around the home?: No If so, are there any without handrails?: No Home free of loose throw rugs in walkways, pet beds, electrical cords, etc?: Yes Adequate lighting in your home to reduce risk of falls?: Yes Life alert?: No Use of a cane, walker or w/c?: No Grab bars in the bathroom?: No Shower chair or bench in shower?: Yes Elevated toilet seat or a handicapped toilet?: No  TIMED UP AND GO:  Was the test performed?  No    Cognitive Function:        12/22/2022    9:53 AM 12/20/2021   10:12 AM  6CIT Screen  What Year? 0 points 0 points  What month? 0 points 0 points  What time? 0 points 0 points  Count back from 20 0 points 0 points  Months in reverse 0 points 0 points  Repeat phrase 0 points 0 points  Total Score 0 points 0 points    Immunizations Immunization History  Administered Date(s) Administered   COVID-19, mRNA, vaccine(Comirnaty)12 years and older 03/09/2022   H1N1  02/04/2008   Influenza Split 01/31/2012, 12/12/2012   Influenza Whole 02/23/2007   Influenza, High Dose Seasonal PF 12/28/2017   Influenza,inj,Quad PF,6+ Mos 12/24/2014, 01/08/2019   Influenza-Unspecified 01/17/2022   PFIZER(Purple Top)SARS-COV-2 Vaccination 04/26/2019, 05/17/2019, 03/09/2020   Pneumococcal Conjugate-13 11/07/2013   Pneumococcal Polysaccharide-23 12/24/2014   Respiratory Syncytial Virus Vaccine,Recomb Aduvanted(Arexvy) 01/17/2022   Tdap 07/06/2012    TDAP status: Due, Education has been provided regarding the importance of this vaccine. Advised may receive this vaccine at local pharmacy or Health Dept. Aware to provide a copy of the vaccination record if obtained from local pharmacy or Health Dept. Verbalized acceptance and understanding.  Flu Vaccine status: Due, Education has been provided regarding the importance of this vaccine. Advised may receive this vaccine at local pharmacy or Health Dept. Aware to provide a copy of the vaccination record if obtained from local pharmacy or Health Dept. Verbalized acceptance and understanding.  Pneumococcal vaccine status: Up to date  Covid-19 vaccine status: Completed vaccines  Qualifies for Shingles Vaccine? Yes   Zostavax completed No   Shingrix Completed?: No.    Education has been provided regarding the importance of this vaccine. Patient has been advised to call insurance company to determine out of pocket expense if they have not yet received this vaccine. Advised may also receive vaccine at local pharmacy or Health Dept. Verbalized acceptance and understanding.  Screening Tests Health Maintenance  Topic Date Due   Zoster Vaccines- Shingrix (1 of 2) Never done   DTaP/Tdap/Td (2 - Td or Tdap) 07/07/2022   INFLUENZA VACCINE  11/03/2022   COVID-19 Vaccine (5 - 2023-24 season) 12/04/2022   Medicare Annual Wellness (AWV)  12/22/2023   MAMMOGRAM  12/29/2023   Colonoscopy  09/01/2025   Pneumonia Vaccine 50+ Years old   Completed   DEXA SCAN  Completed   Hepatitis C Screening  Completed   HPV VACCINES  Aged Out    Health Maintenance  Health Maintenance Due  Topic Date Due   Zoster Vaccines- Shingrix (1 of 2) Never done   DTaP/Tdap/Td (2 - Td or Tdap) 07/07/2022   INFLUENZA VACCINE  11/03/2022   COVID-19 Vaccine (5 - 2023-24 season) 12/04/2022    Colorectal cancer screening: Type of screening: Colonoscopy. Completed 09/02/2015. Repeat every 10 years  Mammogram status: Completed 02/09/2022. Repeat every year  Bone Density status: Completed 01/08/2019. Results reflect: Bone density results: NORMAL. Repeat every 5 years.  Lung Cancer Screening: (Low Dose CT Chest recommended if Age 33-80 years, 20 pack-year currently smoking OR have quit w/in 15years.) does not qualify.   Lung Cancer Screening Referral: no  Additional Screening:  Hepatitis C Screening: does qualify; Completed 06/08/2015  Vision Screening: Recommended annual ophthalmology exams for early detection of glaucoma and other disorders of the eye. Is the patient up to date with their annual eye exam?  Yes  Who is the provider or what is the name of the office in which the patient attends annual eye exams? [facility] If pt is not established with a provider, would they like to be referred to a provider to establish care? No .   Dental Screening: Recommended annual dental exams for proper oral hygiene  Diabetic Foot Exam:  N/A  Community Resource Referral / Chronic Care Management: CRR required this visit?  No   CCM required this visit?  No     Plan:     I have personally reviewed and noted the following in the patient's chart:   Medical and social history Use of alcohol, tobacco or illicit drugs  Current medications and supplements including opioid prescriptions. Patient is not currently taking opioid prescriptions. Functional ability and status Nutritional status Physical activity Advanced directives List of other  physicians Hospitalizations, surgeries, and ER visits in previous 12 months Vitals Screenings to include cognitive, depression, and falls Referrals and appointments  In addition, I have reviewed and discussed with patient certain preventive protocols, quality metrics, and best practice recommendations. A written personalized care plan for preventive services as well as general preventive health recommendations were provided to patient.     Mickeal Needy, LPN   [id number]/8295   After Visit Summary: (MyChart) Due to this being a telephonic visit, the after visit summary with patients personalized plan was offered to patient via MyChart   Nurse Notes: Normal cognitive status assessed by direct observation via telephone conversation by this Nurse Health Advisor. No abnormalities found.  Patient provided his/her weight during this virtual phone visit.

## 2023-01-03 LAB — HM MAMMOGRAPHY

## 2023-01-04 ENCOUNTER — Encounter: Payer: Self-pay | Admitting: Internal Medicine

## 2023-12-25 ENCOUNTER — Ambulatory Visit (INDEPENDENT_AMBULATORY_CARE_PROVIDER_SITE_OTHER): Payer: Medicare PPO

## 2023-12-25 VITALS — Ht 62.0 in | Wt 130.0 lb

## 2023-12-25 DIAGNOSIS — Z0000 Encounter for general adult medical examination without abnormal findings: Secondary | ICD-10-CM | POA: Diagnosis not present

## 2023-12-25 NOTE — Patient Instructions (Addendum)
 Ms. Mauriello,  Thank you for taking the time for your Medicare Wellness Visit. I appreciate your continued commitment to your health goals. Please review the care plan we discussed, and feel free to reach out if I can assist you further.  Medicare recommends these wellness visits once per year to help you and your care team stay ahead of potential health issues. These visits are designed to focus on prevention, allowing your provider to concentrate on managing your acute and chronic conditions during your regular appointments.  Please note that Annual Wellness Visits do not include a physical exam. Some assessments may be limited, especially if the visit was conducted virtually. If needed, we may recommend a separate in-person follow-up with your provider.  Ongoing Care Seeing your primary care provider every 3 to 6 months helps us  monitor your health and provide consistent, personalized care.   Referrals If a referral was made during today's visit and you haven't received any updates within two weeks, please contact the referred provider directly to check on the status.  Recommended Screenings:  Health Maintenance  Topic Date Due   Zoster (Shingles) Vaccine (1 of 2) Never done   DTaP/Tdap/Td vaccine (2 - Td or Tdap) 07/07/2022   Flu Shot  11/03/2023   COVID-19 Vaccine (5 - 2025-26 season) 12/04/2023   Medicare Annual Wellness Visit  12/24/2024   Colon Cancer Screening  09/01/2025   Pneumococcal Vaccine for age over 17  Completed   DEXA scan (bone density measurement)  Completed   Hepatitis C Screening  Completed   HPV Vaccine  Aged Out   Meningitis B Vaccine  Aged Out   Breast Cancer Screening  Discontinued       12/25/2023    2:06 PM  Advanced Directives  Does Patient Have a Medical Advance Directive? No  Would patient like information on creating a medical advance directive? No - Patient declined   Advance Care Planning is important because it: Ensures you receive medical care  that aligns with your values, goals, and preferences. Provides guidance to your family and loved ones, reducing the emotional burden of decision-making during critical moments.  Vision: Annual vision screenings are recommended for early detection of glaucoma, cataracts, and diabetic retinopathy. These exams can also reveal signs of chronic conditions such as diabetes and high blood pressure.  Dental: Annual dental screenings help detect early signs of oral cancer, gum disease, and other conditions linked to overall health, including heart disease and diabetes.

## 2023-12-25 NOTE — Progress Notes (Signed)
 Subjective:   Kelli Jones is a 73 y.o. who presents for a Medicare Wellness preventive visit.  As a reminder, Annual Wellness Visits don't include a physical exam, and some assessments may be limited, especially if this visit is performed virtually. We may recommend an in-person follow-up visit with your provider if needed.  Visit Complete: Virtual I connected with  Kelli Jones on 12/25/23 by a audio enabled telemedicine application and verified that I am speaking with the correct person using two identifiers.  Patient Location: Home  Provider Location: Office/Clinic  I discussed the limitations of evaluation and management by telemedicine. The patient expressed understanding and agreed to proceed.  Vital Signs: Because this visit was a virtual/telehealth visit, some criteria may be missing or patient reported. Any vitals not documented were not able to be obtained and vitals that have been documented are patient reported.  VideoDeclined- This patient declined Librarian, academic. Therefore the visit was completed with audio only.  Persons Participating in Visit: Patient assisted by Spouse, Kelli Jones.  AWV Questionnaire: No: Patient Medicare AWV questionnaire was not completed prior to this visit.  Cardiac Risk Factors include: advanced age (>42men, >14 women);dyslipidemia;hypertension;Other (see comment), Risk factor comments: Pt has not taken any medications >56mths     Objective:    Today's Vitals   12/25/23 1353  Weight: 130 lb (59 kg)  Height: 5' 2 (1.575 m)   Body mass index is 23.78 kg/m.     12/25/2023    2:06 PM 12/22/2022    9:49 AM 12/20/2021   10:22 AM 12/20/2021   10:04 AM 12/18/2020   10:04 AM 02/18/2019    2:52 PM 12/28/2017   10:32 AM  Advanced Directives  Does Patient Have a Medical Advance Directive? No Yes No No No No No   Type of Furniture conservator/restorer;Living will       Does patient want to  make changes to medical advance directive?  --       Copy of Healthcare Power of Attorney in Chart?  No - copy requested       Would patient like information on creating a medical advance directive? No - Patient declined  Yes (MAU/Ambulatory/Procedural Areas - Information given) No - Patient declined No - Patient declined No - Patient declined Yes (ED - Information included in AVS)      Data saved with a previous flowsheet row definition    Current Medications (verified) Outpatient Encounter Medications as of 12/25/2023  Medication Sig   acyclovir  ointment (ZOVIRAX ) 5 % Apply 1 application topically every 3 (three) hours. (Patient not taking: Reported on 12/25/2023)   amLODipine  (NORVASC ) 10 MG tablet Take 1 tablet (10 mg total) by mouth daily. (Patient not taking: Reported on 12/25/2023)   aspirin  81 MG EC tablet Take 1 tablet (81 mg total) by mouth daily. Swallow whole. (Patient not taking: Reported on 12/25/2023)   Biotin w/ Vitamins C & E (HAIR/SKIN/NAILS PO) Take by mouth as needed. (Patient not taking: Reported on 12/25/2023)   Calcium  Carb-Cholecalciferol  (CALCIUM  + D3 PO) Take by mouth daily. (Patient not taking: Reported on 12/25/2023)   Cholecalciferol  50 MCG (2000 UT) TABS 1 tab by mouth once daily (Patient not taking: Reported on 12/25/2023)   cyclobenzaprine  (FLEXERIL ) 5 MG tablet Take 1 tablet (5 mg total) by mouth 3 (three) times daily as needed for muscle spasms. (Patient not taking: Reported on 12/25/2023)   losartan -hydrochlorothiazide (HYZAAR) 100-25 MG tablet Take  1 tablet by mouth daily. (Patient not taking: Reported on 12/25/2023)   meloxicam  (MOBIC ) 15 MG tablet TAKE 1 TABLET(15 MG) BY MOUTH DAILY (Patient not taking: Reported on 12/25/2023)   metoprolol  succinate (TOPROL -XL) 25 MG 24 hr tablet Take 1 tablet (25 mg total) by mouth daily. (Patient not taking: Reported on 12/25/2023)   Multiple Vitamin (MULTIVITAMIN) tablet Take 1 tablet by mouth. Alive Women's MVI-Take one daily  (Patient not taking: Reported on 12/25/2023)   potassium chloride  (KLOR-CON ) 10 MEQ tablet Take 1 tablet (10 mEq total) by mouth daily. (Patient not taking: Reported on 12/25/2023)   rosuvastatin  (CRESTOR ) 40 MG tablet Take 1 tablet (40 mg total) by mouth daily. (Patient not taking: Reported on 12/25/2023)   valACYclovir  (VALTREX ) 500 MG tablet Take 1 tablet (500 mg total) by mouth 2 (two) times daily. With outbreak (Patient not taking: Reported on 12/25/2023)   No facility-administered encounter medications on file as of 12/25/2023.    Allergies (verified) Patient has no known allergies.   History: Past Medical History:  Diagnosis Date   Arthritis    in hands   Dyslipidemia    Hypertension    PID (pelvic inflammatory disease)    Tubal obstruction   STD (sexually transmitted disease)    HSV   Past Surgical History:  Procedure Laterality Date   BREAST BIOPSY  1990   neg/   BREAST LUMPECTOMY WITH RADIOACTIVE SEED LOCALIZATION Left 08/20/2015   Procedure: BREAST LUMPECTOMY WITH RADIOACTIVE SEED LOCALIZATION;  Surgeon: Donnice Lima, MD;  Location: [facility];  Service: General;  Laterality: Left;   DILATION AND CURETTAGE OF UTERUS  1979   PELVIC LAPAROSCOPY     DL tubal lavage   TONSILLECTOMY AND ADENOIDECTOMY     Family History  Problem Relation Age of Onset   Kidney cancer Father    Diabetes Mother    Hypertension Mother    Heart disease Brother    Social History   Socioeconomic History   Marital status: Married    Spouse name: Not on file   Number of children: 1   Years of education: Not on file   Highest education level: Not on file  Occupational History   Not on file  Tobacco Use   Smoking status: Never   Smokeless tobacco: Never  Vaping Use   Vaping status: Never Used  Substance and Sexual Activity   Alcohol use: Not Currently   Drug use: No   Sexual activity: Not Currently    Birth control/protection: Post-menopausal    Comment: 1st  intercourse 33 yo-5 partners, HSV  Other Topics Concern   Not on file  Social History Narrative   Married   Social Drivers of Health   Financial Resource Strain: Low Risk  (12/25/2023)   Overall Financial Resource Strain (CARDIA)    Difficulty of Paying Living Expenses: Not hard at all  Food Insecurity: No Food Insecurity (12/25/2023)   Hunger Vital Sign    Worried About Running Out of Food in the Last Year: Never true    Ran Out of Food in the Last Year: Never true  Transportation Needs: No Transportation Needs (12/25/2023)   PRAPARE - Administrator, Civil Service (Medical): No    Lack of Transportation (Non-Medical): No  Physical Activity: Sufficiently Active (12/25/2023)   Exercise Vital Sign    Days of Exercise per Week: 3 days    Minutes of Exercise per Session: 60 min  Stress: No Stress Concern Present (12/25/2023)  [employer] of Occupational Health - Occupational Stress Questionnaire    Feeling of Stress: Not at all  Social Connections: Socially Isolated (12/25/2023)   Social Connection and Isolation Panel    Frequency of Communication with Friends and Family: Never    Frequency of Social Gatherings with Friends and Family: Never    Attends Religious Services: Never    Database administrator or Organizations: No    Attends Engineer, structural: Never    Marital Status: Married    Tobacco Counseling Counseling given: Not Answered    Clinical Intake:  Pre-visit preparation completed: Yes  Pain : No/denies pain     BMI - recorded: 23.78 Nutritional Status: BMI of 19-24  Normal Nutritional Risks: None Diabetes: No  Lab Results  Component Value Date   HGBA1C 6.1 05/23/2022   HGBA1C 6.3 11/18/2021   HGBA1C 6.0 10/13/2020     How often do you need to have someone help you when you read instructions, pamphlets, or other written materials from your doctor or pharmacy?: 5 - Always (Spouse assists)  Interpreter Needed?:  No  Information entered by :: Verdie Saba, CMA   Activities of Daily Living     12/25/2023    2:10 PM  In your present state of health, do you have any difficulty performing the following activities:  Hearing? 0  Vision? 0  Difficulty concentrating or making decisions? 1  Comment Spouse assists  Walking or climbing stairs? 0  Dressing or bathing? 0  Doing errands, shopping? 1  Comment Spouse handles  Preparing Food and eating ? Y  Comment Spouse handles  Using the Toilet? N  In the past six months, have you accidently leaked urine? N  Do you have problems with loss of bowel control? N  Managing your Medications? Y  Comment pt is not taking any medications - appt to be scheduled  Managing your Finances? Y  Comment Spouse handles  Housekeeping or managing your Housekeeping? Y  Comment Spouse handles    Patient Care Team: Norleen Lynwood ORN, MD as PCP - [facility], [facility] as Consulting Physician (Ophthalmology) Prentiss Annabella LABOR, NP as Nurse Practitioner (Gynecology)  I have updated your Care Teams any recent Medical Services you may have received from other providers in the past year.     Assessment:   This is a routine wellness examination for Soley.  Hearing/Vision screen Hearing Screening - Comments:: Denies hearing difficulties   Vision Screening - Comments:: Spouse denies the pt has any vision concerns   Goals Addressed               This Visit's Progress     Patient Stated (pt-stated)        Patient's Spouse stated that she's starting to have some confusion and he's trying to maintain her health as best as possible.       Depression Screen     12/25/2023    2:02 PM 12/22/2022    9:51 AM 05/23/2022    8:38 AM 12/20/2021   10:08 AM 11/18/2021    8:58 AM 11/18/2021    8:46 AM 12/18/2020   10:20 AM  PHQ 2/9 Scores  PHQ - 2 Score 3 0 0 0 0  0  PHQ- 9 Score 7 0 0      Exception Documentation      Patient refusal     Fall Risk      12/25/2023    1:54 PM 12/22/2022  9:51 AM 05/23/2022    8:38 AM 12/20/2021   10:06 AM 11/18/2021    8:58 AM  Fall Risk   Falls in the past year? 0 0 0 0 0  Number falls in past yr: 0 0 0 0 0  Injury with Fall? 0 0 0 0 0  Risk for fall due to : No Fall Risks No Fall Risks No Fall Risks No Fall Risks   Follow up Falls evaluation completed;Falls prevention discussed Falls prevention discussed Falls evaluation completed Falls prevention discussed       Data saved with a previous flowsheet row definition    MEDICARE RISK AT HOME:  Medicare Risk at Home Any stairs in or around the home?: No If so, are there any without handrails?: No Home free of loose throw rugs in walkways, pet beds, electrical cords, etc?: Yes Adequate lighting in your home to reduce risk of falls?: Yes Life alert?: No Use of a cane, walker or w/c?: No Grab bars in the bathroom?: No Shower chair or bench in shower?: No Elevated toilet seat or a handicapped toilet?: No  TIMED UP AND GO:  Was the test performed?  No  Cognitive Function: Impaired: Patient has current diagnosis of cognitive impairment.    12/25/2023    2:02 PM  MMSE - Mini Mental State Exam  Not completed: Unable to complete        12/22/2022    9:53 AM 12/20/2021   10:12 AM  6CIT Screen  What Year? 0 points 0 points  What month? 0 points 0 points  What time? 0 points 0 points  Count back from 20 0 points 0 points  Months in reverse 0 points 0 points  Repeat phrase 0 points 0 points  Total Score 0 points 0 points    Immunizations Immunization History  Administered Date(s) Administered   H1N1 02/04/2008   INFLUENZA, HIGH DOSE SEASONAL PF 12/28/2017   Influenza Split 01/31/2012, 12/12/2012   Influenza Whole 02/23/2007   Influenza,inj,Quad PF,6+ Mos 12/24/2014, 01/08/2019   Influenza-Unspecified 01/17/2022   PFIZER(Purple Top)SARS-COV-2 Vaccination 04/26/2019, 05/17/2019, 03/09/2020   Pfizer(Comirnaty)Fall Seasonal Vaccine 12 years  and older 03/09/2022   Pneumococcal Conjugate-13 11/07/2013   Pneumococcal Polysaccharide-23 12/24/2014   Respiratory Syncytial Virus Vaccine,Recomb Aduvanted(Arexvy) 01/17/2022   Tdap 07/06/2012    Screening Tests Health Maintenance  Topic Date Due   Zoster Vaccines- Shingrix (1 of 2) Never done   DTaP/Tdap/Td (2 - Td or Tdap) 07/07/2022   Influenza Vaccine  11/03/2023   COVID-19 Vaccine (5 - 2025-26 season) 12/04/2023   Medicare Annual Wellness (AWV)  12/24/2024   Colonoscopy  09/01/2025   Pneumococcal Vaccine: 50+ Years  Completed   DEXA SCAN  Completed   Hepatitis C Screening  Completed   HPV VACCINES  Aged Out   Meningococcal B Vaccine  Aged Out   Mammogram  Discontinued    Health Maintenance Items Addressed:  Additional Screening:  Vision Screening: Recommended annual ophthalmology exams for early detection of glaucoma and other disorders of the eye. Is the patient up to date with their annual eye exam?  No  Who is the provider or what is the name of the office in which the patient attends annual eye exams? Needs to see an Ophthalmologist   Dental Screening: Recommended annual dental exams for proper oral hygiene  Community Resource Referral / Chronic Care Management: CRR required this visit?  No   CCM required this visit?  No   Plan:    I  have personally reviewed and noted the following in the patient's chart:   Medical and social history Use of alcohol, tobacco or illicit drugs  Current medications and supplements including opioid prescriptions. Patient is not currently taking opioid prescriptions. Functional ability and status Nutritional status Physical activity Advanced directives List of other physicians Hospitalizations, surgeries, and ER visits in previous 12 months Vitals Screenings to include cognitive, depression, and falls Referrals and appointments  In addition, I have reviewed and discussed with patient certain preventive protocols,  quality metrics, and best practice recommendations. A written personalized care plan for preventive services as well as general preventive health recommendations were provided to patient.   Verdie CHRISTELLA Saba, CMA   12/25/2023   After Visit Summary: (Declined) Due to this being a telephonic visit, with patients personalized plan was offered to patient but patient Declined AVS at this time   Notes: Scheduled pt an appt w/PCP (not seen since 2024) for a Physical/Review Medications/Lab/Review Mental Changes Assessment for 01/22/24.  The Spouse requested a Monday appt only due to working.  Spouse is unable to determine how long it has been since she's taken all prescribed meds.SABRA

## 2024-01-22 ENCOUNTER — Ambulatory Visit: Admitting: Internal Medicine

## 2024-01-22 ENCOUNTER — Encounter: Payer: Self-pay | Admitting: Internal Medicine

## 2024-01-22 ENCOUNTER — Ambulatory Visit: Payer: Self-pay | Admitting: Internal Medicine

## 2024-01-22 VITALS — BP 136/82 | HR 80 | Temp 98.2°F | Ht 62.0 in | Wt 150.0 lb

## 2024-01-22 DIAGNOSIS — N289 Disorder of kidney and ureter, unspecified: Secondary | ICD-10-CM

## 2024-01-22 DIAGNOSIS — Z0001 Encounter for general adult medical examination with abnormal findings: Secondary | ICD-10-CM

## 2024-01-22 DIAGNOSIS — E559 Vitamin D deficiency, unspecified: Secondary | ICD-10-CM | POA: Diagnosis not present

## 2024-01-22 DIAGNOSIS — E785 Hyperlipidemia, unspecified: Secondary | ICD-10-CM

## 2024-01-22 DIAGNOSIS — I10 Essential (primary) hypertension: Secondary | ICD-10-CM

## 2024-01-22 DIAGNOSIS — Z23 Encounter for immunization: Secondary | ICD-10-CM

## 2024-01-22 DIAGNOSIS — R7302 Impaired glucose tolerance (oral): Secondary | ICD-10-CM | POA: Diagnosis not present

## 2024-01-22 DIAGNOSIS — Z0000 Encounter for general adult medical examination without abnormal findings: Secondary | ICD-10-CM | POA: Diagnosis not present

## 2024-01-22 LAB — CBC WITH DIFFERENTIAL/PLATELET
Basophils Absolute: 0.1 K/uL (ref 0.0–0.1)
Basophils Relative: 0.9 % (ref 0.0–3.0)
Eosinophils Absolute: 0 K/uL (ref 0.0–0.7)
Eosinophils Relative: 0.4 % (ref 0.0–5.0)
HCT: 40.2 % (ref 36.0–46.0)
Hemoglobin: 13 g/dL (ref 12.0–15.0)
Lymphocytes Relative: 32.1 % (ref 12.0–46.0)
Lymphs Abs: 2.5 K/uL (ref 0.7–4.0)
MCHC: 32.2 g/dL (ref 30.0–36.0)
MCV: 86.4 fl (ref 78.0–100.0)
Monocytes Absolute: 0.6 K/uL (ref 0.1–1.0)
Monocytes Relative: 7.7 % (ref 3.0–12.0)
Neutro Abs: 4.5 K/uL (ref 1.4–7.7)
Neutrophils Relative %: 58.9 % (ref 43.0–77.0)
Platelets: 295 K/uL (ref 150.0–400.0)
RBC: 4.65 Mil/uL (ref 3.87–5.11)
RDW: 14.9 % (ref 11.5–15.5)
WBC: 7.6 K/uL (ref 4.0–10.5)

## 2024-01-22 LAB — BASIC METABOLIC PANEL WITH GFR
BUN: 10 mg/dL (ref 6–23)
CO2: 28 meq/L (ref 19–32)
Calcium: 9.5 mg/dL (ref 8.4–10.5)
Chloride: 103 meq/L (ref 96–112)
Creatinine, Ser: 0.81 mg/dL (ref 0.40–1.20)
GFR: 70.83 mL/min (ref >60.00)
Glucose, Bld: 82 mg/dL (ref 70–99)
Potassium: 3.4 meq/L — ABNORMAL LOW (ref 3.5–5.1)
Sodium: 140 meq/L (ref 135–145)

## 2024-01-22 LAB — HEPATIC FUNCTION PANEL
ALT: 8 U/L (ref 0–35)
AST: 16 U/L (ref 0–37)
Albumin: 4.3 g/dL (ref 3.5–5.2)
Alkaline Phosphatase: 73 U/L (ref 39–117)
Bilirubin, Direct: 0.1 mg/dL (ref 0.0–0.3)
Total Bilirubin: 0.4 mg/dL (ref 0.2–1.2)
Total Protein: 8.2 g/dL (ref 6.0–8.3)

## 2024-01-22 LAB — URINALYSIS, ROUTINE W REFLEX MICROSCOPIC
Bilirubin Urine: NEGATIVE
Hgb urine dipstick: NEGATIVE
Nitrite: NEGATIVE
Specific Gravity, Urine: 1.025 (ref 1.000–1.030)
Total Protein, Urine: NEGATIVE
Urine Glucose: NEGATIVE
Urobilinogen, UA: 0.2 (ref 0.0–1.0)
pH: 6 (ref 5.0–8.0)

## 2024-01-22 LAB — LIPID PANEL
Cholesterol: 269 mg/dL — ABNORMAL HIGH (ref 0–200)
HDL: 65.9 mg/dL (ref >39.00)
LDL Cholesterol: 183 mg/dL — ABNORMAL HIGH (ref 0–99)
NonHDL: 202.83
Total CHOL/HDL Ratio: 4
Triglycerides: 99 mg/dL (ref 0.0–149.0)
VLDL: 19.8 mg/dL (ref 0.0–40.0)

## 2024-01-22 LAB — HEMOGLOBIN A1C: Hgb A1c MFr Bld: 5.9 % (ref 4.6–6.5)

## 2024-01-22 LAB — TSH: TSH: 1.08 u[IU]/mL (ref 0.35–5.50)

## 2024-01-22 MED ORDER — ROSUVASTATIN CALCIUM 40 MG PO TABS: 40.0000 mg | ORAL_TABLET | Freq: Every day | ORAL | 3 refills | Status: AC

## 2024-01-22 MED ORDER — LOSARTAN POTASSIUM-HCTZ 100-25 MG PO TABS: 1.0000 | ORAL_TABLET | Freq: Every day | ORAL | 3 refills | Status: AC

## 2024-01-22 MED ORDER — AMLODIPINE BESYLATE 10 MG PO TABS: 10.0000 mg | ORAL_TABLET | Freq: Every day | ORAL | 3 refills | Status: AC

## 2024-01-22 MED ORDER — POTASSIUM CHLORIDE ER 10 MEQ PO TBCR: 10.0000 meq | EXTENDED_RELEASE_TABLET | Freq: Every day | ORAL | 3 refills | Status: AC

## 2024-01-22 MED ORDER — METOPROLOL SUCCINATE ER 25 MG PO TB24: 25.0000 mg | ORAL_TABLET | Freq: Every day | ORAL | 3 refills | Status: AC

## 2024-01-22 NOTE — Progress Notes (Unsigned)
 Patient ID: Kelli Jones, female   DOB: October 14, 1950, 73 y.o.   MRN: [id number]         Chief Complaint:: wellness exam and low vit d, renal insufficiency, hyperglycemia, htn, dyslipidemia       HPI:  Kelli Jones is a 73 y.o. female here for wellness exam, for flu shot today, for tdap and shingrix at pharmacy, o/w up to date               Also Pt denies chest pain, increased sob or doe, wheezing, orthopnea, PND, increased LE swelling, palpitations, dizziness or syncope.   Pt denies polydipsia, polyuria, or new focal neuro s/s.    Pt denies fever, wt loss, night sweats, loss of appetite, or other constitutional symptoms  Due for flu shot   Wt Readings from Last 3 Encounters:  01/22/24 150 lb (68 kg)  12/25/23 130 lb (59 kg)  12/22/22 162 lb (73.5 kg)   BP Readings from Last 3 Encounters:  01/22/24 136/82  05/23/22 130/76  11/18/21 (!) 178/96   Immunization History  Administered Date(s) Administered   H1N1 02/04/2008   INFLUENZA, HIGH DOSE SEASONAL PF 12/28/2017, 01/22/2024   Influenza Split 01/31/2012, 12/12/2012   Influenza Whole 02/23/2007   Influenza,inj,Quad PF,6+ Mos 12/24/2014, 01/08/2019   Influenza-Unspecified 01/17/2022   PFIZER(Purple Top)SARS-COV-2 Vaccination 04/26/2019, 05/17/2019, 03/09/2020   Pfizer(Comirnaty)Fall Seasonal Vaccine 12 years and older 03/09/2022   Pneumococcal Conjugate-13 11/07/2013   Pneumococcal Polysaccharide-23 12/24/2014   Respiratory Syncytial Virus Vaccine,Recomb Aduvanted(Arexvy) 01/17/2022   Tdap 07/06/2012   Health Maintenance Due  Topic Date Due   Zoster Vaccines- Shingrix (1 of 2) Never done   DTaP/Tdap/Td (2 - Td or Tdap) 07/07/2022      Past Medical History:  Diagnosis Date   Arthritis    in hands   Dyslipidemia    Hypertension    PID (pelvic inflammatory disease)    Tubal obstruction   STD (sexually transmitted disease)    HSV   Past Surgical History:  Procedure Laterality Date   BREAST BIOPSY  1990   neg/   BREAST  LUMPECTOMY WITH RADIOACTIVE SEED LOCALIZATION Left 08/20/2015   Procedure: BREAST LUMPECTOMY WITH RADIOACTIVE SEED LOCALIZATION;  Surgeon: Donnice Lima, MD;  Location: [facility];  Service: General;  Laterality: Left;   DILATION AND CURETTAGE OF UTERUS  1979   PELVIC LAPAROSCOPY     DL tubal lavage   TONSILLECTOMY AND ADENOIDECTOMY      reports that she has never smoked. She has never used smokeless tobacco. She reports that she does not currently use alcohol. She reports that she does not use drugs. family history includes Diabetes in her mother; Heart disease in her brother; Hypertension in her mother; Kidney cancer in her father. No Known Allergies Current Outpatient Medications on File Prior to Visit  Medication Sig Dispense Refill   acyclovir  ointment (ZOVIRAX ) 5 % Apply 1 application topically every 3 (three) hours. 3 g 5   aspirin  81 MG EC tablet Take 1 tablet (81 mg total) by mouth daily. Swallow whole. 30 tablet 12   Biotin w/ Vitamins C & E (HAIR/SKIN/NAILS PO) Take by mouth as needed.     Calcium  Carb-Cholecalciferol  (CALCIUM  + D3 PO) Take by mouth daily.     Cholecalciferol  50 MCG (2000 UT) TABS 1 tab by mouth once daily 30 tablet 99   cyclobenzaprine  (FLEXERIL ) 5 MG tablet Take 1 tablet (5 mg total) by mouth 3 (three) times daily as needed for  muscle spasms. 60 tablet 1   meloxicam  (MOBIC ) 15 MG tablet TAKE 1 TABLET(15 MG) BY MOUTH DAILY 30 tablet 5   Multiple Vitamin (MULTIVITAMIN) tablet Take 1 tablet by mouth. Alive Women's MVI-Take one daily     valACYclovir  (VALTREX ) 500 MG tablet Take 1 tablet (500 mg total) by mouth 2 (two) times daily. With outbreak 60 tablet 12   No current facility-administered medications on file prior to visit.        ROS:  All others reviewed and negative.  Objective        PE:  BP 136/82 (BP Location: Left Arm, Patient Position: Sitting, Cuff Size: Normal)   Pulse 80   Temp 98.2 F (36.8 C) (Oral)   Ht 5' 2 (1.575 m)   Wt  150 lb (68 kg)   SpO2 99%   BMI 27.44 kg/m                 Constitutional: Pt appears in NAD               HENT: Head: NCAT.                Right Ear: External ear normal.                 Left Ear: External ear normal.                Eyes: . Pupils are equal, round, and reactive to light. Conjunctivae and EOM are normal               Nose: without d/c or deformity               Neck: Neck supple. Gross normal ROM               Cardiovascular: Normal rate and regular rhythm.                 Pulmonary/Chest: Effort normal and breath sounds without rales or wheezing.                Abd:  Soft, NT, ND, + BS, no organomegaly               Neurological: Pt is alert. At baseline orientation, motor grossly intact               Skin: Skin is warm. No rashes, no other new lesions, LE edema - none               Psychiatric: Pt behavior is normal without agitation   Micro: none  Cardiac tracings I have personally interpreted today:  none  Pertinent Radiological findings (summarize): none   Lab Results  Component Value Date   WBC 7.6 01/22/2024   HGB 13.0 01/22/2024   HCT 40.2 01/22/2024   PLT 295.0 01/22/2024   GLUCOSE 82 01/22/2024   CHOL 269 (H) 01/22/2024   TRIG 99.0 01/22/2024   HDL 65.90 01/22/2024   LDLCALC 183 (H) 01/22/2024   ALT 8 01/22/2024   AST 16 01/22/2024   NA 140 01/22/2024   K 3.4 (L) 01/22/2024   CL 103 01/22/2024   CREATININE 0.81 01/22/2024   BUN 10 01/22/2024   CO2 28 01/22/2024   TSH 1.08 01/22/2024   HGBA1C 5.9 01/22/2024   Assessment/Plan:  Kelli Jones is a 73 y.o. Black or African American [2] female with  has a past medical history of Arthritis, Dyslipidemia, Hypertension, PID (pelvic inflammatory disease), and STD (sexually  transmitted disease).  Renal insufficiency Unclear significance  Lab Results  Component Value Date   CREATININE 0.99 05/23/2022   With GFR 56 last year for first time,cont to avoid nephrotoxins, recheck lab  today   Encounter for well adult exam with abnormal findings Age and sex appropriate education and counseling updated with regular exercise and diet Referrals for preventative services - none needed Immunizations addressed - for tdap and shingrx at pharmacy, for flu shot today Smoking counseling  - none needed Evidence for depression or other mood disorder - none significant Most recent labs reviewed. I have personally reviewed and have noted: 1) the patient's medical and social history 2) The patient's current medications and supplements 3) The patient's height, weight, and BMI have been recorded in the chart\  Vitamin D  deficiency Last vitamin D  Lab Results  Component Value Date   VD25OH 51.43 05/23/2022   Stable, cont oral replacement   Impaired glucose tolerance Lab Results  Component Value Date   HGBA1C 5.9 01/22/2024   Stable, pt to continue current medical treatment  - diet, wt control   Hypertension, uncontrolled BP Readings from Last 3 Encounters:  01/22/24 136/82  05/23/22 130/76  11/18/21 (!) 178/96   Mildly high, pt states ok at home, pt to continue medical treatment norvasc  10 every day, hyzaar 100 25 every day, toprol  xl 25 qd   Dyslipidemia Lab Results  Component Value Date   LDLCALC 183 (H) 01/22/2024   Uncontrolled, pt to restart the crestor  40 mg every day, lower chol diet  Followup: Return in about 1 year (around 01/21/2025).  Lynwood Rush, MD 01/24/2024 7:17 PM [facility]

## 2024-01-22 NOTE — Assessment & Plan Note (Signed)
 Unclear significance  Lab Results  Component Value Date   CREATININE 0.99 05/23/2022   With GFR 56 last year for first time,cont to avoid nephrotoxins, recheck lab today

## 2024-01-22 NOTE — Patient Instructions (Signed)
 You had the flu shot today  Please consider having the Tdap tetanus shot, and Shingles shot at the pharmacy  Please continue all other medications as before, and refills have been done if requested.  Please have the pharmacy call with any other refills you may need.  Please continue your efforts at being more active, low cholesterol diet, and weight control.  You are otherwise up to date with prevention measures today.  Please keep your appointments with your specialists as you may have planned  Please go to the LAB at the blood drawing area for the tests to be done  You will be contacted by phone if any changes need to be made immediately.  Otherwise, you will receive a letter about your results with an explanation, but please check with MyChart first.  Please make an Appointment to return for your 1 year visit, or sooner if needed

## 2024-01-24 NOTE — Assessment & Plan Note (Signed)
Last vitamin D Lab Results  Component Value Date   VD25OH 51.43 05/23/2022   Stable, cont oral replacement

## 2024-01-24 NOTE — Assessment & Plan Note (Signed)
 BP Readings from Last 3 Encounters:  01/22/24 136/82  05/23/22 130/76  11/18/21 (!) 178/96   Mildly high, pt states ok at home, pt to continue medical treatment norvasc  10 every day, hyzaar 100 25 every day, toprol  xl 25 qd

## 2024-01-24 NOTE — Assessment & Plan Note (Signed)
 Lab Results  Component Value Date   LDLCALC 183 (H) 01/22/2024   Uncontrolled, pt to restart the crestor  40 mg every day, lower chol diet

## 2024-01-24 NOTE — Assessment & Plan Note (Signed)
 Lab Results  Component Value Date   HGBA1C 5.9 01/22/2024   Stable, pt to continue current medical treatment  - diet, wt control

## 2024-01-24 NOTE — Assessment & Plan Note (Signed)
 Age and sex appropriate education and counseling updated with regular exercise and diet Referrals for preventative services - none needed Immunizations addressed - for tdap and shingrx at pharmacy, for flu shot today Smoking counseling  - none needed Evidence for depression or other mood disorder - none significant Most recent labs reviewed. I have personally reviewed and have noted: 1) the patient's medical and social history 2) The patient's current medications and supplements 3) The patient's height, weight, and BMI have been recorded in the chart\
